# Patient Record
Sex: Male | Born: 1940 | Race: White | Hispanic: No | Marital: Married | Smoking: Former smoker
Health system: Southern US, Community
[De-identification: ages and names within clinical notes are randomized; demographics above are authoritative.]

## PROBLEM LIST (undated history)

## (undated) DIAGNOSIS — Z860100 Personal history of colon polyps, unspecified: Secondary | ICD-10-CM

## (undated) DIAGNOSIS — M069 Rheumatoid arthritis, unspecified: Secondary | ICD-10-CM

## (undated) HISTORY — DX: Personal history of colonic polyps: Z86.010

## (undated) HISTORY — DX: Rheumatoid arthritis, unspecified: M06.9

## (undated) HISTORY — DX: Personal history of colon polyps, unspecified: Z86.0100

## (undated) HISTORY — PX: ROTATOR CUFF REPAIR: SHX139

---

## 2006-06-05 ENCOUNTER — Encounter: Payer: Self-pay | Admitting: Internal Medicine

## 2006-10-15 LAB — CONVERTED CEMR LAB
PSA: 1.9 ng/mL
PSA: 1.9 ng/mL
PSA: 1.9 ng/mL
PSA: 1.9 ng/mL
PSA: 1.9 ng/mL
PSA: 1.9 ng/mL

## 2008-01-08 ENCOUNTER — Ambulatory Visit: Payer: Self-pay | Admitting: Internal Medicine

## 2008-01-08 DIAGNOSIS — M069 Rheumatoid arthritis, unspecified: Secondary | ICD-10-CM | POA: Insufficient documentation

## 2008-01-08 DIAGNOSIS — Z860100 Personal history of colon polyps, unspecified: Secondary | ICD-10-CM | POA: Insufficient documentation

## 2008-01-08 LAB — CONVERTED CEMR LAB
Bilirubin Urine: NEGATIVE
Glucose, Urine, Semiquant: NEGATIVE
Ketones, urine, test strip: NEGATIVE
Nitrite: NEGATIVE
Protein, U semiquant: NEGATIVE
Specific Gravity, Urine: 1.01
Urobilinogen, UA: 0.2
WBC Urine, dipstick: NEGATIVE
pH: 6

## 2008-01-09 LAB — CONVERTED CEMR LAB
ALT: 20 units/L (ref 0–53)
AST: 22 units/L (ref 0–37)
Albumin: 3.8 g/dL (ref 3.5–5.2)
Alkaline Phosphatase: 48 units/L (ref 39–117)
BUN: 14 mg/dL (ref 6–23)
Basophils Absolute: 0 10*3/uL (ref 0.0–0.1)
Basophils Relative: 0.1 % (ref 0.0–3.0)
Bilirubin, Direct: 0.1 mg/dL (ref 0.0–0.3)
CO2: 30 meq/L (ref 19–32)
Calcium: 9 mg/dL (ref 8.4–10.5)
Chloride: 104 meq/L (ref 96–112)
Cholesterol: 159 mg/dL (ref 0–200)
Creatinine, Ser: 0.9 mg/dL (ref 0.4–1.5)
Eosinophils Absolute: 0.1 10*3/uL (ref 0.0–0.7)
Eosinophils Relative: 1.9 % (ref 0.0–5.0)
GFR calc Af Amer: 109 mL/min
GFR calc non Af Amer: 90 mL/min
Glucose, Bld: 80 mg/dL (ref 70–99)
HCT: 46.4 % (ref 39.0–52.0)
HDL: 40.2 mg/dL (ref >39.0)
Hemoglobin: 15.6 g/dL (ref 13.0–17.0)
LDL Cholesterol: 96 mg/dL (ref 0–99)
Lymphocytes Relative: 21.8 % (ref 12.0–46.0)
MCHC: 33.7 g/dL (ref 30.0–36.0)
MCV: 96 fL (ref 78.0–100.0)
Monocytes Absolute: 0.6 10*3/uL (ref 0.1–1.0)
Monocytes Relative: 9.5 % (ref 3.0–12.0)
Neutro Abs: 4.6 10*3/uL (ref 1.4–7.7)
Neutrophils Relative %: 66.7 % (ref 43.0–77.0)
PSA: 2.3 ng/mL (ref 0.10–4.00)
Platelets: 208 10*3/uL (ref 150–400)
Potassium: 4 meq/L (ref 3.5–5.1)
RBC: 4.84 M/uL (ref 4.22–5.81)
RDW: 13.9 % (ref 11.5–14.6)
Sodium: 140 meq/L (ref 135–145)
TSH: 2.03 microintl units/mL (ref 0.35–5.50)
Total Bilirubin: 1 mg/dL (ref 0.3–1.2)
Total CHOL/HDL Ratio: 4
Total Protein: 7.5 g/dL (ref 6.0–8.3)
Triglycerides: 115 mg/dL (ref 0–149)
VLDL: 23 mg/dL (ref 0–40)
WBC: 6.8 10*3/uL (ref 4.5–10.5)

## 2008-01-10 ENCOUNTER — Encounter: Payer: Self-pay | Admitting: Internal Medicine

## 2008-02-11 ENCOUNTER — Encounter: Payer: Self-pay | Admitting: Internal Medicine

## 2009-02-12 ENCOUNTER — Ambulatory Visit: Payer: Self-pay | Admitting: Internal Medicine

## 2009-02-12 LAB — CONVERTED CEMR LAB
ALT: 19 units/L (ref 0–53)
AST: 28 units/L (ref 0–37)
Albumin: 3.9 g/dL (ref 3.5–5.2)
Alkaline Phosphatase: 51 units/L (ref 39–117)
BUN: 17 mg/dL (ref 6–23)
Basophils Absolute: 0 10*3/uL (ref 0.0–0.1)
Basophils Relative: 0.1 % (ref 0.0–3.0)
Bilirubin Urine: NEGATIVE
Bilirubin, Direct: 0 mg/dL (ref 0.0–0.3)
Blood in Urine, dipstick: NEGATIVE
CO2: 29 meq/L (ref 19–32)
Calcium: 8.7 mg/dL (ref 8.4–10.5)
Chloride: 109 meq/L (ref 96–112)
Cholesterol: 174 mg/dL (ref 0–200)
Creatinine, Ser: 0.9 mg/dL (ref 0.4–1.5)
Eosinophils Absolute: 0.2 10*3/uL (ref 0.0–0.7)
Eosinophils Relative: 2.8 % (ref 0.0–5.0)
GFR calc non Af Amer: 89.42 mL/min (ref >60)
Glucose, Bld: 100 mg/dL — ABNORMAL HIGH (ref 70–99)
Glucose, Urine, Semiquant: NEGATIVE
HCT: 47.1 % (ref 39.0–52.0)
HDL: 43.2 mg/dL (ref >39.00)
Hemoglobin: 16 g/dL (ref 13.0–17.0)
Ketones, urine, test strip: NEGATIVE
LDL Cholesterol: 113 mg/dL — ABNORMAL HIGH (ref 0–99)
Lymphocytes Relative: 22.4 % (ref 12.0–46.0)
Lymphs Abs: 1.6 10*3/uL (ref 0.7–4.0)
MCHC: 34.1 g/dL (ref 30.0–36.0)
MCV: 95 fL (ref 78.0–100.0)
Monocytes Absolute: 0.8 10*3/uL (ref 0.1–1.0)
Monocytes Relative: 10.9 % (ref 3.0–12.0)
Neutro Abs: 4.6 10*3/uL (ref 1.4–7.7)
Neutrophils Relative %: 63.8 % (ref 43.0–77.0)
Nitrite: NEGATIVE
PSA: 1.84 ng/mL (ref 0.10–4.00)
Platelets: 166 10*3/uL (ref 150.0–400.0)
Potassium: 4 meq/L (ref 3.5–5.1)
Protein, U semiquant: NEGATIVE
RBC: 4.96 M/uL (ref 4.22–5.81)
RDW: 14 % (ref 11.5–14.6)
Sodium: 142 meq/L (ref 135–145)
Specific Gravity, Urine: 1.015
TSH: 1.5 microintl units/mL (ref 0.35–5.50)
Total Bilirubin: 0.8 mg/dL (ref 0.3–1.2)
Total CHOL/HDL Ratio: 4
Total Protein: 7.9 g/dL (ref 6.0–8.3)
Triglycerides: 89 mg/dL (ref 0.0–149.0)
Urobilinogen, UA: 0.2
VLDL: 17.8 mg/dL (ref 0.0–40.0)
Vit D, 25-Hydroxy: 41 ng/mL (ref 30–89)
WBC Urine, dipstick: NEGATIVE
WBC: 7.2 10*3/uL (ref 4.5–10.5)
pH: 5.5

## 2009-02-18 ENCOUNTER — Ambulatory Visit: Payer: Self-pay | Admitting: Internal Medicine

## 2009-02-18 DIAGNOSIS — T783XXA Angioneurotic edema, initial encounter: Secondary | ICD-10-CM | POA: Insufficient documentation

## 2009-02-22 ENCOUNTER — Encounter: Payer: Self-pay | Admitting: Internal Medicine

## 2009-08-23 ENCOUNTER — Ambulatory Visit: Payer: Self-pay | Admitting: Internal Medicine

## 2009-08-23 LAB — CONVERTED CEMR LAB
AST: 27 units/L (ref 0–37)
BUN: 15 mg/dL (ref 6–23)
Basophils Absolute: 0 10*3/uL (ref 0.0–0.1)
Basophils Relative: 0.7 % (ref 0.0–3.0)
CRP, High Sensitivity: 3.5 (ref 0.00–5.00)
Eosinophils Absolute: 0.1 10*3/uL (ref 0.0–0.7)
GFR calc non Af Amer: 89.27 mL/min (ref >60)
MCHC: 33.3 g/dL (ref 30.0–36.0)
MCV: 97 fL (ref 78.0–100.0)
Monocytes Absolute: 0.6 10*3/uL (ref 0.1–1.0)
Neutrophils Relative %: 62.2 % (ref 43.0–77.0)
Platelets: 161 10*3/uL (ref 150.0–400.0)
Potassium: 4.1 meq/L (ref 3.5–5.1)
RBC: 4.77 M/uL (ref 4.22–5.81)
RDW: 13.8 % (ref 11.5–14.6)
Sed Rate: 10 mm/hr (ref 0–22)
Total Bilirubin: 0.7 mg/dL (ref 0.3–1.2)

## 2009-11-24 ENCOUNTER — Ambulatory Visit: Payer: Self-pay | Admitting: Internal Medicine

## 2009-11-24 LAB — CONVERTED CEMR LAB
ALT: 19 units/L (ref 0–53)
BUN: 18 mg/dL (ref 6–23)
Basophils Relative: 0.5 % (ref 0.0–3.0)
CO2: 30 meq/L (ref 19–32)
CRP, High Sensitivity: 3.44 (ref 0.00–5.00)
Chloride: 105 meq/L (ref 96–112)
Creatinine, Ser: 1 mg/dL (ref 0.4–1.5)
Hemoglobin: 15.3 g/dL (ref 13.0–17.0)
Lymphocytes Relative: 19.4 % (ref 12.0–46.0)
MCHC: 34 g/dL (ref 30.0–36.0)
Monocytes Relative: 10.3 % (ref 3.0–12.0)
Neutro Abs: 5.3 10*3/uL (ref 1.4–7.7)
RBC: 4.77 M/uL (ref 4.22–5.81)
Sed Rate: 12 mm/hr (ref 0–22)
Total Protein: 7.3 g/dL (ref 6.0–8.3)

## 2010-02-15 ENCOUNTER — Ambulatory Visit: Payer: Self-pay | Admitting: Internal Medicine

## 2010-02-15 LAB — CONVERTED CEMR LAB
Alkaline Phosphatase: 50 units/L (ref 39–117)
Basophils Relative: 0.5 % (ref 0.0–3.0)
Bilirubin, Direct: 0.1 mg/dL (ref 0.0–0.3)
CO2: 29 meq/L (ref 19–32)
Calcium: 8.9 mg/dL (ref 8.4–10.5)
Creatinine, Ser: 0.9 mg/dL (ref 0.4–1.5)
Eosinophils Absolute: 0.1 10*3/uL (ref 0.0–0.7)
Lymphocytes Relative: 20.4 % (ref 12.0–46.0)
MCHC: 34 g/dL (ref 30.0–36.0)
Neutrophils Relative %: 66.1 % (ref 43.0–77.0)
RBC: 4.79 M/uL (ref 4.22–5.81)
Total Bilirubin: 0.7 mg/dL (ref 0.3–1.2)
Total Protein: 6.9 g/dL (ref 6.0–8.3)
WBC: 7 10*3/uL (ref 4.5–10.5)

## 2010-02-18 ENCOUNTER — Telehealth: Payer: Self-pay | Admitting: Internal Medicine

## 2010-02-25 ENCOUNTER — Ambulatory Visit: Payer: Self-pay | Admitting: Internal Medicine

## 2010-02-25 LAB — CONVERTED CEMR LAB
HDL: 43.4 mg/dL (ref >39.00)
LDL Cholesterol: 107 mg/dL — ABNORMAL HIGH (ref 0–99)
Total CHOL/HDL Ratio: 4
Triglycerides: 88 mg/dL (ref 0.0–149.0)
VLDL: 17.6 mg/dL (ref 0.0–40.0)

## 2010-03-21 ENCOUNTER — Ambulatory Visit: Payer: Self-pay | Admitting: Internal Medicine

## 2010-07-14 ENCOUNTER — Other Ambulatory Visit: Payer: Self-pay | Admitting: Internal Medicine

## 2010-07-14 ENCOUNTER — Ambulatory Visit
Admission: RE | Admit: 2010-07-14 | Discharge: 2010-07-14 | Payer: Self-pay | Source: Home / Self Care | Attending: Internal Medicine | Admitting: Internal Medicine

## 2010-07-14 LAB — BASIC METABOLIC PANEL
BUN: 21 mg/dL (ref 6–23)
CO2: 29 mEq/L (ref 19–32)
Calcium: 8.7 mg/dL (ref 8.4–10.5)
Chloride: 101 mEq/L (ref 96–112)
Creatinine, Ser: 1 mg/dL (ref 0.4–1.5)
GFR: 78.84 mL/min (ref >60.00)
Glucose, Bld: 98 mg/dL (ref 70–99)
Potassium: 4.9 mEq/L (ref 3.5–5.1)
Sodium: 136 mEq/L (ref 135–145)

## 2010-07-14 LAB — CBC WITH DIFFERENTIAL/PLATELET
Basophils Absolute: 0 10*3/uL (ref 0.0–0.1)
Basophils Relative: 0.5 % (ref 0.0–3.0)
Eosinophils Absolute: 0.1 10*3/uL (ref 0.0–0.7)
Eosinophils Relative: 1.6 % (ref 0.0–5.0)
HCT: 46.6 % (ref 39.0–52.0)
Hemoglobin: 15.8 g/dL (ref 13.0–17.0)
Lymphocytes Relative: 20.7 % (ref 12.0–46.0)
Lymphs Abs: 1.7 10*3/uL (ref 0.7–4.0)
MCHC: 34 g/dL (ref 30.0–36.0)
MCV: 94.9 fl (ref 78.0–100.0)
Monocytes Absolute: 0.8 10*3/uL (ref 0.1–1.0)
Monocytes Relative: 9.8 % (ref 3.0–12.0)
Neutro Abs: 5.4 10*3/uL (ref 1.4–7.7)
Neutrophils Relative %: 67.4 % (ref 43.0–77.0)
Platelets: 169 10*3/uL (ref 150.0–400.0)
RBC: 4.91 Mil/uL (ref 4.22–5.81)
RDW: 15.2 % — ABNORMAL HIGH (ref 11.5–14.6)
WBC: 8 10*3/uL (ref 4.5–10.5)

## 2010-07-14 LAB — HEPATIC FUNCTION PANEL
ALT: 23 U/L (ref 0–53)
AST: 27 U/L (ref 0–37)
Albumin: 3.9 g/dL (ref 3.5–5.2)
Alkaline Phosphatase: 53 U/L (ref 39–117)
Bilirubin, Direct: 0.1 mg/dL (ref 0.0–0.3)
Total Bilirubin: 0.4 mg/dL (ref 0.3–1.2)
Total Protein: 7.3 g/dL (ref 6.0–8.3)

## 2010-07-14 LAB — SEDIMENTATION RATE: Sed Rate: 10 mm/hr (ref 0–22)

## 2010-07-14 LAB — HIGH SENSITIVITY CRP: CRP, High Sensitivity: 2.5 mg/L (ref 0.00–5.00)

## 2010-07-26 NOTE — Assessment & Plan Note (Signed)
Summary: productive cough/cjr    Vital Signs:  Patient profile:   70 year old male Weight:      224 pounds Temp:     97.8 degrees F oral BP sitting:   140 / 102  (left arm) Cuff size:   regular  Vitals Entered By: Kathrynn Speed CMA (March 21, 2010 9:23 AM) CC: Productive cough, src   CC:  Productive cough and src.  History of Present Illness: 70 year old patient, who presents with a 3-day history of chest congestion and productive cough.  He is on immunosuppressant therapy due to rheumatoid arthritis.  He had some fever earlier in the week that seems to have improved.  He is still expectorating green sputum throughout the day, but most prominently in the morning.  Denies any chest pain or shortness of breath.  No recent fever or chills.  Preventive Screening-Counseling & Management  Alcohol-Tobacco     Smoking Status: quit     Year Quit: x 40 years  Current Medications (verified): 1)  Sulfasalazine 500 Mg  Tabs (Sulfasalazine) .... Take 1 Tablet By Mouth Once A Day 2)  Methotrexate 2.5 Mg  Tabs (Methotrexate Sodium) .... 4 Tablets Once A Week 3)  Folic Acid 1 Mg  Tabs (Folic Acid) .... Take 1 Tablet By Mouth Once A Day Except Methotrexate Day 4)  Aspirin 81 Mg  Tbec (Aspirin) .... Once Daily 5)  Co Q-10 50 Mg  Caps (Coenzyme Q10) .... Once Daily 6)  Vitamin C Cr 1000 Mg  Cr-Tabs (Ascorbic Acid) .... Once Daily 7)  Vitamin E Natural 400 Unit  Caps (Vitamin E) .... Once Daily 8)  Multivitamins  Tabs (Multiple Vitamin) .... Once Daily 9)  Zyrtec Hives Relief 10 Mg Tabs (Cetirizine Hcl) .... Take One Tab By Mouth Once Daily 10)  Fish Oil 1000 Mg Caps (Omega-3 Fatty Acids) .... Take One Tab By Mouth Once Daily  Allergies (verified): No Known Drug Allergies  Past History:  Past Medical History: Reviewed history from 01/08/2008 and no changes required. Colonic polyps, hx of Rheumatoid arthritis  Review of Systems       The patient complains of prolonged cough.  The  patient denies anorexia, fever, weight loss, weight gain, vision loss, decreased hearing, hoarseness, chest pain, syncope, dyspnea on exertion, peripheral edema, headaches, hemoptysis, abdominal pain, melena, hematochezia, severe indigestion/heartburn, hematuria, incontinence, genital sores, muscle weakness, suspicious skin lesions, transient blindness, difficulty walking, depression, unusual weight change, abnormal bleeding, enlarged lymph nodes, angioedema, and breast masses.    Physical Exam  General:  overweight-appearing.  no distress.  Blood pressure 140/96overweight-appearing.   Head:  Normocephalic and atraumatic without obvious abnormalities. No apparent alopecia or balding. Eyes:  No corneal or conjunctival inflammation noted. EOMI. Perrla. Funduscopic exam benign, without hemorrhages, exudates or papilledema. Vision grossly normal. Ears:  External ear exam shows no significant lesions or deformities.  Otoscopic examination reveals clear canals, tympanic membranes are intact bilaterally without bulging, retraction, inflammation or discharge. Hearing is grossly normal bilaterally. Mouth:  Oral mucosa and oropharynx without lesions or exudates.  Teeth in good repair. Neck:  No deformities, masses, or tenderness noted. Lungs:  scattered rhonchi noted at both lung bases Heart:  Normal rate and regular rhythm. S1 and S2 normal without gallop, murmur, click, rub or other extra sounds.   Impression & Recommendations:  Problem # 1:  BRONCHITIS-ACUTE (ICD-466.0)  His updated medication list for this problem includes:    Hydrocodone-homatropine 5-1.5 Mg/55ml Syrp (Hydrocodone-homatropine) .Marland Kitchen... 1 teaspoon every 6 hours  as needed for cough  His updated medication list for this problem includes:    Hydrocodone-homatropine 5-1.5 Mg/11ml Syrp (Hydrocodone-homatropine) .Marland Kitchen... 1 teaspoon every 6 hours as needed for cough  Complete Medication List: 1)  Sulfasalazine 500 Mg Tabs (Sulfasalazine) ....  Take 1 tablet by mouth once a day 2)  Methotrexate 2.5 Mg Tabs (Methotrexate sodium) .... 4 tablets once a week 3)  Folic Acid 1 Mg Tabs (Folic acid) .... Take 1 tablet by mouth once a day except methotrexate day 4)  Aspirin 81 Mg Tbec (Aspirin) .... Once daily 5)  Co Q-10 50 Mg Caps (Coenzyme q10) .... Once daily 6)  Vitamin C Cr 1000 Mg Cr-tabs (Ascorbic acid) .... Once daily 7)  Vitamin E Natural 400 Unit Caps (Vitamin e) .... Once daily 8)  Multivitamins Tabs (Multiple vitamin) .... Once daily 9)  Zyrtec Hives Relief 10 Mg Tabs (Cetirizine hcl) .... Take one tab by mouth once daily 10)  Fish Oil 1000 Mg Caps (Omega-3 fatty acids) .... Take one tab by mouth once daily 11)  Hydrocodone-homatropine 5-1.5 Mg/63ml Syrp (Hydrocodone-homatropine) .Marland Kitchen.. 1 teaspoon every 6 hours as needed for cough  Patient Instructions: 1)  Get plenty of rest, drink lots of clear liquids, and use Tylenol or Ibuprofen for fever and comfort. Return in 7-10 days if you're not better:sooner if you're feeling worse. 2)  Check your Blood Pressure regularly. If it is above: 140/90 you should make an appointment. Prescriptions: HYDROCODONE-HOMATROPINE 5-1.5 MG/5ML SYRP (HYDROCODONE-HOMATROPINE) 1 teaspoon every 6 hours as needed for cough  #6oz x 0   Entered and Authorized by:   Gordy Savers  MD   Signed by:   Gordy Savers  MD on 03/21/2010   Method used:   Print then Give to Patient   RxID:   [id number]

## 2010-07-26 NOTE — Assessment & Plan Note (Signed)
Summary: pt will come in fasting/njr   Vital Signs:  Patient profile:   70 year old male Height:      66 inches Weight:      222 pounds BMI:     35.96 Temp:     98.4 degrees F oral Pulse rate:   80 / minute Pulse rhythm:   regular BP sitting:   158 / 98  (left arm) Cuff size:   regular  Vitals Entered By: Kern Reap CMA Duncan Dull) (February 25, 2010 8:06 AM)  Nutrition Counseling: Patient's BMI is greater than 25 and therefore counseled on weight management options. CC: annual wellness exam Is Patient Diabetic? No Pain Assessment Patient in pain? no        CC:  annual wellness exam.  History of Present Illness: cpx  Preventive Screening-Counseling & Management  Alcohol-Tobacco     Smoking Status: quit     Year Quit: x 40 years  Current Problems (verified): 1)  Angioedema  (ICD-995.1) 2)  Preventive Health Care  (ICD-V70.0) 3)  Rheumatoid Arthritis  (ICD-714.0) 4)  Colonic Polyps, Hx of  (ICD-V12.72)  Current Medications (verified): 1)  Sulfasalazine 500 Mg  Tabs (Sulfasalazine) .... Take 1 Tablet By Mouth Once A Day 2)  Methotrexate 2.5 Mg  Tabs (Methotrexate Sodium) .... 4 Tablets Once A Week 3)  Folic Acid 1 Mg  Tabs (Folic Acid) .... Take 1 Tablet By Mouth Once A Day Except Methotrexate Day 4)  Aspirin 81 Mg  Tbec (Aspirin) .... Once Daily 5)  Co Q-10 50 Mg  Caps (Coenzyme Q10) .... Once Daily 6)  Vitamin C Cr 1000 Mg  Cr-Tabs (Ascorbic Acid) .... Once Daily 7)  Vitamin E Natural 400 Unit  Caps (Vitamin E) .... Once Daily 8)  Multivitamins  Tabs (Multiple Vitamin) .... Once Daily 9)  Zyrtec Hives Relief 10 Mg Tabs (Cetirizine Hcl) .... Take One Tab By Mouth Once Daily 10)  Fish Oil 1000 Mg Caps (Omega-3 Fatty Acids) .... Take One Tab By Mouth Once Daily  Allergies (verified): No Known Drug Allergies  Past History:  Past Medical History: Last updated: 01/15/2008 Colonic polyps, hx of Rheumatoid arthritis  Past Surgical History: Last updated:  2008-01-15 Rotator cuff repair--right  Family History: Last updated: 2008/01/15 father-deceased MVA age 73 mother deceased 58 yo CHF, Asthma  Social History: Last updated: Jan 15, 2008 Occupation: "navigators"---[employer]  Never Smoked widowed twice Alcohol use-yes Regular exercise-yes 25 minutes cardio daily  Risk Factors: Exercise: yes (15-Jan-2008)  Risk Factors: Smoking Status: quit (02/25/2010)  Social History: Smoking Status:  quit  Review of Systems       Flu Vaccine Consent Questions     Do you have a history of severe allergic reactions to this vaccine? no    Any prior history of allergic reactions to egg and/or gelatin? no    Do you have a sensitivity to the preservative Thimersol? no    Do you have a past history of Guillan-Barre Syndrome? no    Do you currently have an acute febrile illness? no    Have you ever had a severe reaction to latex? no    Vaccine information given and explained to patient? yes    Are you currently pregnant? no    Lot Number:[id number]   Exp Date:12/24/2010   Site Given  Left Deltoid IM   Physical Exam  General:  alert and well-developed.   Head:  normocephalic and atraumatic.   Eyes:  pupils equal and pupils round.  Ears:  R ear normal and L ear normal.   Neck:  No deformities, masses, or tenderness noted. Chest Wall:  No deformities, masses, tenderness or gynecomastia noted. Lungs:  Normal respiratory effort, chest expands symmetrically. Lungs are clear to auscultation, no crackles or wheezes. Heart:  normal rate and regular rhythm.   Abdomen:  soft and non-tender.  overweight Prostate:  no nodules and no asymmetry.   Msk:  No deformity or scoliosis noted of thoracic or lumbar spine.   Neurologic:  cranial nerves II-XII intact and gait normal.   Skin:  turgor normal and color normal.  few AKs Cervical Nodes:  no anterior cervical adenopathy and no posterior cervical adenopathy.   Psych:  normally  interactive and good eye contact.     Impression & Recommendations:  Problem # 1:  PREVENTIVE HEALTH CARE (ICD-V70.0)  health maint UTD  Orders: Venipuncture (16109) TLB-Lipid Panel (80061-LIPID) TLB-PSA (Prostate Specific Antigen) (84153-PSA)  Problem # 2:  RHEUMATOID ARTHRITIS (ICD-714.0) followed by rheumatology labs normal  Complete Medication List: 1)  Sulfasalazine 500 Mg Tabs (Sulfasalazine) .... Take 1 tablet by mouth once a day 2)  Methotrexate 2.5 Mg Tabs (Methotrexate sodium) .... 4 tablets once a week 3)  Folic Acid 1 Mg Tabs (Folic acid) .... Take 1 tablet by mouth once a day except methotrexate day 4)  Aspirin 81 Mg Tbec (Aspirin) .... Once daily 5)  Co Q-10 50 Mg Caps (Coenzyme q10) .... Once daily 6)  Vitamin C Cr 1000 Mg Cr-tabs (Ascorbic acid) .... Once daily 7)  Vitamin E Natural 400 Unit Caps (Vitamin e) .... Once daily 8)  Multivitamins Tabs (Multiple vitamin) .... Once daily 9)  Zyrtec Hives Relief 10 Mg Tabs (Cetirizine hcl) .... Take one tab by mouth once daily 10)  Fish Oil 1000 Mg Caps (Omega-3 fatty acids) .... Take one tab by mouth once daily  Other Orders: Admin 1st Vaccine (60454) Flu Vaccine 66yrs + (09811)    Appended Document: Orders Update    Clinical Lists Changes  Orders: Added new Service order of Specimen Handling (91478) - Signed

## 2010-07-26 NOTE — Progress Notes (Signed)
Summary: Pt req to pick up copy of labs from 02/15/10  Phone Note Call from Patient Call back at Home Phone [phone]   Caller: Patient Summary of Call: Pt req to come in and pick up a copy of labs from Aug 23,2011. Please call pt when ready for pick up. Initial call taken by: Lucy Antigua,  February 18, 2010 9:56 AM  Follow-up for Phone Call        Notified pt. Follow-up by: Lynann Beaver CMA,  February 18, 2010 11:30 AM

## 2010-07-26 NOTE — Letter (Signed)
Summary: [facility]   Imported By: Maryln Gottron 09/22/2009 12:46:20  _____________________________________________________________________  External Attachment:    Type:   Image     Comment:   External Document

## 2011-02-24 ENCOUNTER — Encounter: Payer: Self-pay | Admitting: Internal Medicine

## 2011-03-01 ENCOUNTER — Ambulatory Visit (INDEPENDENT_AMBULATORY_CARE_PROVIDER_SITE_OTHER): Payer: 59 | Admitting: Internal Medicine

## 2011-03-01 ENCOUNTER — Encounter: Payer: Self-pay | Admitting: Internal Medicine

## 2011-03-01 VITALS — BP 158/100 | HR 92 | Temp 97.9°F | Ht 66.0 in | Wt 228.0 lb

## 2011-03-01 DIAGNOSIS — Z23 Encounter for immunization: Secondary | ICD-10-CM

## 2011-03-01 DIAGNOSIS — Z0000 Encounter for general adult medical examination without abnormal findings: Secondary | ICD-10-CM

## 2011-03-01 DIAGNOSIS — M069 Rheumatoid arthritis, unspecified: Secondary | ICD-10-CM

## 2011-03-01 LAB — BASIC METABOLIC PANEL
BUN: 14 mg/dL (ref 6–23)
Creatinine, Ser: 1 mg/dL (ref 0.4–1.5)
GFR: 83.5 mL/min (ref >60.00)

## 2011-03-01 LAB — CBC WITH DIFFERENTIAL/PLATELET
Basophils Relative: 0.5 % (ref 0.0–3.0)
Eosinophils Relative: 1.6 % (ref 0.0–5.0)
Lymphocytes Relative: 21.1 % (ref 12.0–46.0)
MCV: 94.3 fl (ref 78.0–100.0)
Neutrophils Relative %: 67 % (ref 43.0–77.0)
RBC: 5.03 Mil/uL (ref 4.22–5.81)
WBC: 6.6 10*3/uL (ref 4.5–10.5)

## 2011-03-01 LAB — POCT URINALYSIS DIPSTICK
Blood, UA: NEGATIVE
Glucose, UA: NEGATIVE
Nitrite, UA: NEGATIVE
Protein, UA: NEGATIVE
Urobilinogen, UA: 0.2

## 2011-03-01 LAB — HEPATIC FUNCTION PANEL
ALT: 25 U/L (ref 0–53)
Alkaline Phosphatase: 53 U/L (ref 39–117)
Bilirubin, Direct: 0.1 mg/dL (ref 0.0–0.3)
Total Protein: 7.1 g/dL (ref 6.0–8.3)

## 2011-03-01 LAB — SEDIMENTATION RATE: Sed Rate: 11 mm/hr (ref 0–22)

## 2011-03-01 LAB — LIPID PANEL
HDL: 48.8 mg/dL (ref >39.00)
Total CHOL/HDL Ratio: 3

## 2011-03-01 LAB — PSA: PSA: 2.21 ng/mL (ref 0.10–4.00)

## 2011-03-01 NOTE — Progress Notes (Signed)
  Subjective:    Patient ID: Jimmy Williams, male    DOB: 08/22/1940, 70 y.o.   MRN: [id number]  HPI  cpx Home bps: 130s/70s  Pt has no complaints Has regular f/u with rheumatology  Past Medical History  Diagnosis Date  . Hx of colonic polyps   . Rheumatoid arthritis    Past Surgical History  Procedure Date  . Rotator cuff repair     right    reports that he has quit smoking. He does not have any smokeless tobacco history on file. His alcohol and drug histories not on file. family history is not on file. No Known Allergies   Review of Systems  patient denies chest pain, shortness of breath, orthopnea. Denies lower extremity edema, abdominal pain, change in appetite, change in bowel movements. Patient denies rashes, musculoskeletal complaints. No other specific complaints in a complete review of systems.      Objective:   Physical Exam   well-developed well-nourished male in no acute distress. HEENT exam atraumatic, normocephalic, neck supple without jugular venous distention. Chest clear to auscultation cardiac exam S1-S2 are regular. Abdominal exam overweight with bowel sounds, soft and nontender. Extremities no edema. Neurologic exam is alert with a normal gait.       Assessment & Plan:   Health maint UTD

## 2011-05-16 ENCOUNTER — Telehealth: Payer: Self-pay | Admitting: Internal Medicine

## 2011-05-16 DIAGNOSIS — M069 Rheumatoid arthritis, unspecified: Secondary | ICD-10-CM

## 2011-05-16 NOTE — Telephone Encounter (Signed)
Pt has been seeing Dr. Dareen Piano a Rheumatologist but he is out of his network, pt is requesting to be referred to a new doctor within his network. Please contact pt

## 2011-05-16 NOTE — Telephone Encounter (Signed)
Referral order placed.

## 2016-09-11 DIAGNOSIS — E663 Overweight: Secondary | ICD-10-CM | POA: Diagnosis not present

## 2016-09-11 DIAGNOSIS — Z79899 Other long term (current) drug therapy: Secondary | ICD-10-CM | POA: Diagnosis not present

## 2016-09-11 DIAGNOSIS — Z6829 Body mass index (BMI) 29.0-29.9, adult: Secondary | ICD-10-CM | POA: Diagnosis not present

## 2016-09-11 DIAGNOSIS — M2550 Pain in unspecified joint: Secondary | ICD-10-CM | POA: Diagnosis not present

## 2016-09-11 DIAGNOSIS — M0589 Other rheumatoid arthritis with rheumatoid factor of multiple sites: Secondary | ICD-10-CM | POA: Diagnosis not present

## 2016-11-29 DIAGNOSIS — L570 Actinic keratosis: Secondary | ICD-10-CM | POA: Diagnosis not present

## 2016-11-29 DIAGNOSIS — D225 Melanocytic nevi of trunk: Secondary | ICD-10-CM | POA: Diagnosis not present

## 2016-11-29 DIAGNOSIS — L814 Other melanin hyperpigmentation: Secondary | ICD-10-CM | POA: Diagnosis not present

## 2016-11-29 DIAGNOSIS — L821 Other seborrheic keratosis: Secondary | ICD-10-CM | POA: Diagnosis not present

## 2016-12-12 DIAGNOSIS — E663 Overweight: Secondary | ICD-10-CM | POA: Diagnosis not present

## 2016-12-12 DIAGNOSIS — Z6829 Body mass index (BMI) 29.0-29.9, adult: Secondary | ICD-10-CM | POA: Diagnosis not present

## 2016-12-12 DIAGNOSIS — M2550 Pain in unspecified joint: Secondary | ICD-10-CM | POA: Diagnosis not present

## 2016-12-12 DIAGNOSIS — Z79899 Other long term (current) drug therapy: Secondary | ICD-10-CM | POA: Diagnosis not present

## 2016-12-12 DIAGNOSIS — M0589 Other rheumatoid arthritis with rheumatoid factor of multiple sites: Secondary | ICD-10-CM | POA: Diagnosis not present

## 2016-12-17 DIAGNOSIS — J069 Acute upper respiratory infection, unspecified: Secondary | ICD-10-CM | POA: Diagnosis not present

## 2017-03-09 DIAGNOSIS — Z87891 Personal history of nicotine dependence: Secondary | ICD-10-CM | POA: Diagnosis not present

## 2017-03-09 DIAGNOSIS — Z23 Encounter for immunization: Secondary | ICD-10-CM | POA: Diagnosis not present

## 2017-03-09 DIAGNOSIS — E7800 Pure hypercholesterolemia, unspecified: Secondary | ICD-10-CM | POA: Diagnosis not present

## 2017-03-09 DIAGNOSIS — I10 Essential (primary) hypertension: Secondary | ICD-10-CM | POA: Diagnosis not present

## 2017-03-09 DIAGNOSIS — M05739 Rheumatoid arthritis with rheumatoid factor of unspecified wrist without organ or systems involvement: Secondary | ICD-10-CM | POA: Diagnosis not present

## 2017-03-15 DIAGNOSIS — E559 Vitamin D deficiency, unspecified: Secondary | ICD-10-CM | POA: Diagnosis not present

## 2017-03-15 DIAGNOSIS — E7800 Pure hypercholesterolemia, unspecified: Secondary | ICD-10-CM | POA: Diagnosis not present

## 2017-03-15 DIAGNOSIS — I10 Essential (primary) hypertension: Secondary | ICD-10-CM | POA: Diagnosis not present

## 2017-05-16 DIAGNOSIS — Z87891 Personal history of nicotine dependence: Secondary | ICD-10-CM | POA: Diagnosis not present

## 2017-05-16 DIAGNOSIS — Z6828 Body mass index (BMI) 28.0-28.9, adult: Secondary | ICD-10-CM | POA: Diagnosis not present

## 2017-05-16 DIAGNOSIS — S76211A Strain of adductor muscle, fascia and tendon of right thigh, initial encounter: Secondary | ICD-10-CM | POA: Diagnosis not present

## 2017-05-31 DIAGNOSIS — Z6828 Body mass index (BMI) 28.0-28.9, adult: Secondary | ICD-10-CM | POA: Diagnosis not present

## 2017-05-31 DIAGNOSIS — K4090 Unilateral inguinal hernia, without obstruction or gangrene, not specified as recurrent: Secondary | ICD-10-CM | POA: Diagnosis not present

## 2017-06-06 DIAGNOSIS — Z01810 Encounter for preprocedural cardiovascular examination: Secondary | ICD-10-CM | POA: Diagnosis not present

## 2017-06-06 DIAGNOSIS — Z87891 Personal history of nicotine dependence: Secondary | ICD-10-CM | POA: Diagnosis not present

## 2017-06-06 DIAGNOSIS — K4090 Unilateral inguinal hernia, without obstruction or gangrene, not specified as recurrent: Secondary | ICD-10-CM | POA: Diagnosis not present

## 2017-06-06 DIAGNOSIS — I10 Essential (primary) hypertension: Secondary | ICD-10-CM | POA: Diagnosis not present

## 2017-06-06 DIAGNOSIS — M069 Rheumatoid arthritis, unspecified: Secondary | ICD-10-CM | POA: Diagnosis not present

## 2017-07-03 DIAGNOSIS — M0589 Other rheumatoid arthritis with rheumatoid factor of multiple sites: Secondary | ICD-10-CM | POA: Diagnosis not present

## 2017-08-10 DIAGNOSIS — M2550 Pain in unspecified joint: Secondary | ICD-10-CM | POA: Diagnosis not present

## 2017-08-10 DIAGNOSIS — Z79899 Other long term (current) drug therapy: Secondary | ICD-10-CM | POA: Diagnosis not present

## 2017-08-10 DIAGNOSIS — E663 Overweight: Secondary | ICD-10-CM | POA: Diagnosis not present

## 2017-08-10 DIAGNOSIS — Z6829 Body mass index (BMI) 29.0-29.9, adult: Secondary | ICD-10-CM | POA: Diagnosis not present

## 2017-08-10 DIAGNOSIS — M0589 Other rheumatoid arthritis with rheumatoid factor of multiple sites: Secondary | ICD-10-CM | POA: Diagnosis not present

## 2017-08-13 DIAGNOSIS — N529 Male erectile dysfunction, unspecified: Secondary | ICD-10-CM | POA: Diagnosis not present

## 2017-08-13 DIAGNOSIS — Z0000 Encounter for general adult medical examination without abnormal findings: Secondary | ICD-10-CM | POA: Diagnosis not present

## 2017-08-13 DIAGNOSIS — Z1389 Encounter for screening for other disorder: Secondary | ICD-10-CM | POA: Diagnosis not present

## 2017-08-13 DIAGNOSIS — Z125 Encounter for screening for malignant neoplasm of prostate: Secondary | ICD-10-CM | POA: Diagnosis not present

## 2017-08-13 DIAGNOSIS — Z136 Encounter for screening for cardiovascular disorders: Secondary | ICD-10-CM | POA: Diagnosis not present

## 2017-08-13 DIAGNOSIS — M069 Rheumatoid arthritis, unspecified: Secondary | ICD-10-CM | POA: Diagnosis not present

## 2017-09-13 DIAGNOSIS — Z87891 Personal history of nicotine dependence: Secondary | ICD-10-CM | POA: Diagnosis not present

## 2017-09-13 DIAGNOSIS — Z6828 Body mass index (BMI) 28.0-28.9, adult: Secondary | ICD-10-CM | POA: Diagnosis not present

## 2017-09-13 DIAGNOSIS — F430 Acute stress reaction: Secondary | ICD-10-CM | POA: Diagnosis not present

## 2017-09-28 DIAGNOSIS — Z87891 Personal history of nicotine dependence: Secondary | ICD-10-CM | POA: Diagnosis not present

## 2017-09-28 DIAGNOSIS — J029 Acute pharyngitis, unspecified: Secondary | ICD-10-CM | POA: Diagnosis not present

## 2017-09-28 DIAGNOSIS — Z6828 Body mass index (BMI) 28.0-28.9, adult: Secondary | ICD-10-CM | POA: Diagnosis not present

## 2017-10-10 DIAGNOSIS — R05 Cough: Secondary | ICD-10-CM | POA: Diagnosis not present

## 2017-10-10 DIAGNOSIS — Z87891 Personal history of nicotine dependence: Secondary | ICD-10-CM | POA: Diagnosis not present

## 2017-10-10 DIAGNOSIS — Z6828 Body mass index (BMI) 28.0-28.9, adult: Secondary | ICD-10-CM | POA: Diagnosis not present

## 2017-10-25 DIAGNOSIS — Z6828 Body mass index (BMI) 28.0-28.9, adult: Secondary | ICD-10-CM | POA: Diagnosis not present

## 2017-10-25 DIAGNOSIS — Z79899 Other long term (current) drug therapy: Secondary | ICD-10-CM | POA: Diagnosis not present

## 2017-10-25 DIAGNOSIS — Z87891 Personal history of nicotine dependence: Secondary | ICD-10-CM | POA: Diagnosis not present

## 2017-10-25 DIAGNOSIS — J301 Allergic rhinitis due to pollen: Secondary | ICD-10-CM | POA: Diagnosis not present

## 2017-10-25 DIAGNOSIS — M05739 Rheumatoid arthritis with rheumatoid factor of unspecified wrist without organ or systems involvement: Secondary | ICD-10-CM | POA: Diagnosis not present

## 2017-11-30 DIAGNOSIS — M0589 Other rheumatoid arthritis with rheumatoid factor of multiple sites: Secondary | ICD-10-CM | POA: Diagnosis not present

## 2018-02-07 DIAGNOSIS — E663 Overweight: Secondary | ICD-10-CM | POA: Diagnosis not present

## 2018-02-07 DIAGNOSIS — M2550 Pain in unspecified joint: Secondary | ICD-10-CM | POA: Diagnosis not present

## 2018-02-07 DIAGNOSIS — M0589 Other rheumatoid arthritis with rheumatoid factor of multiple sites: Secondary | ICD-10-CM | POA: Diagnosis not present

## 2018-02-07 DIAGNOSIS — Z79899 Other long term (current) drug therapy: Secondary | ICD-10-CM | POA: Diagnosis not present

## 2018-02-07 DIAGNOSIS — Z6827 Body mass index (BMI) 27.0-27.9, adult: Secondary | ICD-10-CM | POA: Diagnosis not present

## 2018-02-12 DIAGNOSIS — R634 Abnormal weight loss: Secondary | ICD-10-CM | POA: Diagnosis not present

## 2018-02-12 DIAGNOSIS — I10 Essential (primary) hypertension: Secondary | ICD-10-CM | POA: Diagnosis not present

## 2018-02-12 DIAGNOSIS — H00015 Hordeolum externum left lower eyelid: Secondary | ICD-10-CM | POA: Diagnosis not present

## 2018-03-06 DIAGNOSIS — L989 Disorder of the skin and subcutaneous tissue, unspecified: Secondary | ICD-10-CM | POA: Diagnosis not present

## 2018-03-06 DIAGNOSIS — L821 Other seborrheic keratosis: Secondary | ICD-10-CM | POA: Diagnosis not present

## 2018-03-06 DIAGNOSIS — L814 Other melanin hyperpigmentation: Secondary | ICD-10-CM | POA: Diagnosis not present

## 2018-03-06 DIAGNOSIS — D229 Melanocytic nevi, unspecified: Secondary | ICD-10-CM | POA: Diagnosis not present

## 2018-03-06 DIAGNOSIS — D485 Neoplasm of uncertain behavior of skin: Secondary | ICD-10-CM | POA: Diagnosis not present

## 2018-03-06 DIAGNOSIS — D0359 Melanoma in situ of other part of trunk: Secondary | ICD-10-CM | POA: Diagnosis not present

## 2018-04-01 DIAGNOSIS — L905 Scar conditions and fibrosis of skin: Secondary | ICD-10-CM | POA: Diagnosis not present

## 2018-04-01 DIAGNOSIS — D0359 Melanoma in situ of other part of trunk: Secondary | ICD-10-CM | POA: Diagnosis not present

## 2018-05-10 DIAGNOSIS — M0589 Other rheumatoid arthritis with rheumatoid factor of multiple sites: Secondary | ICD-10-CM | POA: Diagnosis not present

## 2018-07-16 DIAGNOSIS — D229 Melanocytic nevi, unspecified: Secondary | ICD-10-CM | POA: Diagnosis not present

## 2018-07-16 DIAGNOSIS — D1801 Hemangioma of skin and subcutaneous tissue: Secondary | ICD-10-CM | POA: Diagnosis not present

## 2018-07-16 DIAGNOSIS — L819 Disorder of pigmentation, unspecified: Secondary | ICD-10-CM | POA: Diagnosis not present

## 2018-07-16 DIAGNOSIS — L814 Other melanin hyperpigmentation: Secondary | ICD-10-CM | POA: Diagnosis not present

## 2018-07-16 DIAGNOSIS — Z85820 Personal history of malignant melanoma of skin: Secondary | ICD-10-CM | POA: Diagnosis not present

## 2018-07-16 DIAGNOSIS — L821 Other seborrheic keratosis: Secondary | ICD-10-CM | POA: Diagnosis not present

## 2018-08-14 DIAGNOSIS — Z6829 Body mass index (BMI) 29.0-29.9, adult: Secondary | ICD-10-CM | POA: Diagnosis not present

## 2018-08-14 DIAGNOSIS — E663 Overweight: Secondary | ICD-10-CM | POA: Diagnosis not present

## 2018-08-14 DIAGNOSIS — Z79899 Other long term (current) drug therapy: Secondary | ICD-10-CM | POA: Diagnosis not present

## 2018-08-14 DIAGNOSIS — M0589 Other rheumatoid arthritis with rheumatoid factor of multiple sites: Secondary | ICD-10-CM | POA: Diagnosis not present

## 2018-08-14 DIAGNOSIS — M2550 Pain in unspecified joint: Secondary | ICD-10-CM | POA: Diagnosis not present

## 2018-08-15 DIAGNOSIS — Z1389 Encounter for screening for other disorder: Secondary | ICD-10-CM | POA: Diagnosis not present

## 2018-08-15 DIAGNOSIS — Z0000 Encounter for general adult medical examination without abnormal findings: Secondary | ICD-10-CM | POA: Diagnosis not present

## 2018-08-15 DIAGNOSIS — M069 Rheumatoid arthritis, unspecified: Secondary | ICD-10-CM | POA: Diagnosis not present

## 2018-08-15 DIAGNOSIS — I10 Essential (primary) hypertension: Secondary | ICD-10-CM | POA: Diagnosis not present

## 2018-11-05 DIAGNOSIS — M0589 Other rheumatoid arthritis with rheumatoid factor of multiple sites: Secondary | ICD-10-CM | POA: Diagnosis not present

## 2018-11-05 DIAGNOSIS — L84 Corns and callosities: Secondary | ICD-10-CM | POA: Diagnosis not present

## 2018-12-04 ENCOUNTER — Other Ambulatory Visit: Payer: Self-pay

## 2018-12-04 ENCOUNTER — Encounter: Payer: Self-pay | Admitting: Podiatry

## 2018-12-04 ENCOUNTER — Ambulatory Visit: Payer: Medicare PPO | Admitting: Podiatry

## 2018-12-04 VITALS — BP 148/84 | Temp 97.5°F

## 2018-12-04 DIAGNOSIS — M79671 Pain in right foot: Secondary | ICD-10-CM

## 2018-12-04 DIAGNOSIS — Q828 Other specified congenital malformations of skin: Secondary | ICD-10-CM | POA: Diagnosis not present

## 2018-12-04 NOTE — Patient Instructions (Signed)

## 2018-12-10 DIAGNOSIS — H524 Presbyopia: Secondary | ICD-10-CM | POA: Diagnosis not present

## 2018-12-10 DIAGNOSIS — H2513 Age-related nuclear cataract, bilateral: Secondary | ICD-10-CM | POA: Diagnosis not present

## 2018-12-18 NOTE — Progress Notes (Signed)
Subjective: Jimmy Williams presents today with cc painful callus noted on plantar aspect of right foot. Duration is greater than several months. Pain interferes  with daily activities which require weightbearing. He has attempted routine use of pumice stone with minimal relief. Pain is getting progressively worse. Patient is seeking professional help regarding symptoms.  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  Past Medical History:  Diagnosis Date  . Hx of colonic polyps   . Rheumatoid arthritis(714.0)      Patient Active Problem List   Diagnosis Date Noted  . ANGIOEDEMA 02/18/2009  . RHEUMATOID ARTHRITIS 01/08/2008  . COLONIC POLYPS, HX OF 01/08/2008     Past Surgical History:  Procedure Laterality Date  . ROTATOR CUFF REPAIR     right      Current Outpatient Medications:  .  ascorbic acid (VITAMIN C) 1000 MG tablet, Take 10,000 mg by mouth daily.  , Disp: , Rfl:  .  aspirin 81 MG chewable tablet, Chew 81 mg by mouth daily.  , Disp: , Rfl:  .  B-D TB SYRINGE 1CC/27GX1/2" 27G X 1/2" 1 ML MISC, , Disp: , Rfl:  .  cetirizine (ZYRTEC HIVES RELIEF) 10 MG tablet, Take 10 mg by mouth daily.  , Disp: , Rfl:  .  Cholecalciferol (VITAMIN D-1000 MAX ST) 25 MCG (1000 UT) tablet, Take by mouth., Disp: , Rfl:  .  Coenzyme Q10 50 MG CAPS, Take by mouth daily.  , Disp: , Rfl:  .  fish oil-omega-3 fatty acids 1000 MG capsule, Take 1,000 mg by mouth daily. , Disp: , Rfl:  .  folic acid (FOLVITE) 1 MG tablet, Take 1 mg by mouth daily. Methotrexate day , Disp: , Rfl:  .  losartan (COZAAR) 100 MG tablet, Take by mouth., Disp: , Rfl:  .  methotrexate (RHEUMATREX) 2.5 MG tablet, once a week. Take 8 tablets once a week. Caution:Chemotherapy. Protect from light., Disp: , Rfl:  .  Multiple Vitamin (MULTIVITAMIN) capsule, Take 1 capsule by mouth daily.  , Disp: , Rfl:  .  Resveratrol 100 MG CAPS, Take by mouth., Disp: , Rfl:  .  sulfaSALAzine  (AZULFIDINE) 500 MG tablet, Take 500 mg by mouth daily.  , Disp: , Rfl:  .  vitamin E (NATURAL VITAMIN E) 400 UNIT capsule, Take 400 Units by mouth daily.  , Disp: , Rfl:    No Known Allergies   Social History   Occupational History  . Not on file  Tobacco Use  . Smoking status: Former Research scientist (life sciences)  . Smokeless tobacco: Former Network engineer and Sexual Activity  . Alcohol use: Not on file  . Drug use: Not on file  . Sexual activity: Not on file     History reviewed. No pertinent family history.   Immunization History  Administered Date(s) Administered  . Influenza Whole 02/25/2010  . Influenza-Unspecified 03/26/2013  . Pneumococcal Polysaccharide-23 12/25/2007  . Td 06/26/2000  . Tdap 03/01/2011     Review of systems: Positive Findings in bold print.  Constitutional:  chills, fatigue, fever, sweats, weight change Communication: Optometrist, sign Ecologist, hand writing, iPad/Android device Head: headaches, head injury Eyes: changes in vision, eye pain, glaucoma, cataracts, macular degeneration, diplopia, glare,  light sensitivity, eyeglasses or contacts, blindness Ears nose mouth throat: hearing impaired, hearing aids,  ringing in ears, deaf, sign language,  vertigo,   nosebleeds,  rhinitis,  cold sores, snoring, swollen glands Cardiovascular: HTN, edema, arrhythmia,  pacemaker in place, defibrillator in place, chest pain/tightness, chronic anticoagulation, blood clot, heart failure, MI Peripheral Vascular: leg cramps, varicose veins, blood clots, lymphedema, varicosities Respiratory:  difficulty breathing, denies congestion, SOB, wheezing, cough, emphysema Gastrointestinal: change in appetite or weight, abdominal pain, constipation, diarrhea, nausea, vomiting, vomiting blood, change in bowel habits, abdominal pain, jaundice, rectal bleeding, hemorrhoids, GERD Genitourinary:  nocturia,  pain on urination, polyuria,  blood in urine, Clubb catheter, urinary urgency, ESRD on  hemodialysis Musculoskeletal: amputation, cramping, stiff joints, painful joints, decreased joint motion, fractures, OA, gout, hemiplegia, paraplegia, uses cane, wheelchair bound, uses walker, uses rollator Skin: , color change, dryness, itching, mole changes+changes in toenails,  rash, wound(s) Neurological: headaches, numbness in feet, paresthesias in feet, burning in feet, fainting,  seizures, change in speech. denies headaches, memory problems/poor historian, cerebral palsy, weakness, paralysis, CVA, TIA Endocrine: diabetes, hypothyroidism, hyperthyroidism,  goiter, dry mouth, flushing, heat intolerance,  cold intolerance,  excessive thirst, denies polyuria,  nocturia Hematological:  easy bleeding, excessive bleeding, easy bruising, enlarged lymph nodes, on long term blood thinner, history of past transusions Allergy/immunological:  hives, eczema, frequent infections, multiple drug allergies, seasonal allergies, transplant recipient, multiple food allergies Psychiatric:  anxiety, depression, mood disorder, suicidal ideations, hallucinations, insomnia  Objective: Vitals:   12/04/18 1113  BP: (!) 148/84  Temp: (!) 97.5 F (36.4 C)    Vascular Examination: Capillary refill time immediate  x 10 digits.  Dorsalis pedis and posterior tibial pulses present b/l.  No digital hair x 10 digits.  Skin temperature WNL b/l.  Dermatological Examination: Skin with normal turgor, texture and tone b/l.  Toenails 1-5 b/l are well maintained with adequate length.  Porokeratotic lesion plantar forefoot right foot with tenderness to palpation. No erythema, no edema, no drainage, no flocculence.  Musculoskeletal: Muscle strength 5/5 to all LE muscle groups.  Neurological: Sensation intact 5/5 b/l with 10 gram monofilament.  Vibratory sensation intact.  Assessment: Painful porokeratosis plantar forefoot right foot  Plan: 1. Discussed porokeratosis etiology and treatment  options. Porokeratosis plantar right forefoot pared and enucleated with sterile scalpel blade without incident. Applied callus pad for comfort. Patient purchased 2 additional pads. Patient to report any pedal injuries to medical professional immediately. Follow up as needed. Patient/POA to call should there be a concern in the interim.

## 2019-01-14 DIAGNOSIS — D229 Melanocytic nevi, unspecified: Secondary | ICD-10-CM | POA: Diagnosis not present

## 2019-01-14 DIAGNOSIS — L821 Other seborrheic keratosis: Secondary | ICD-10-CM | POA: Diagnosis not present

## 2019-01-14 DIAGNOSIS — L570 Actinic keratosis: Secondary | ICD-10-CM | POA: Diagnosis not present

## 2019-01-14 DIAGNOSIS — L853 Xerosis cutis: Secondary | ICD-10-CM | POA: Diagnosis not present

## 2019-01-14 DIAGNOSIS — L814 Other melanin hyperpigmentation: Secondary | ICD-10-CM | POA: Diagnosis not present

## 2019-01-14 DIAGNOSIS — Z85820 Personal history of malignant melanoma of skin: Secondary | ICD-10-CM | POA: Diagnosis not present

## 2019-01-14 DIAGNOSIS — L819 Disorder of pigmentation, unspecified: Secondary | ICD-10-CM | POA: Diagnosis not present

## 2019-01-14 DIAGNOSIS — D1801 Hemangioma of skin and subcutaneous tissue: Secondary | ICD-10-CM | POA: Diagnosis not present

## 2019-02-12 DIAGNOSIS — Z79899 Other long term (current) drug therapy: Secondary | ICD-10-CM | POA: Diagnosis not present

## 2019-02-12 DIAGNOSIS — M0589 Other rheumatoid arthritis with rheumatoid factor of multiple sites: Secondary | ICD-10-CM | POA: Diagnosis not present

## 2019-02-12 DIAGNOSIS — M2550 Pain in unspecified joint: Secondary | ICD-10-CM | POA: Diagnosis not present

## 2019-03-28 DIAGNOSIS — R05 Cough: Secondary | ICD-10-CM | POA: Diagnosis not present

## 2019-03-28 DIAGNOSIS — Z23 Encounter for immunization: Secondary | ICD-10-CM | POA: Diagnosis not present

## 2019-03-28 DIAGNOSIS — R490 Dysphonia: Secondary | ICD-10-CM | POA: Diagnosis not present

## 2019-04-02 DIAGNOSIS — R490 Dysphonia: Secondary | ICD-10-CM | POA: Diagnosis not present

## 2019-05-15 DIAGNOSIS — M0589 Other rheumatoid arthritis with rheumatoid factor of multiple sites: Secondary | ICD-10-CM | POA: Diagnosis not present

## 2019-07-15 DIAGNOSIS — L814 Other melanin hyperpigmentation: Secondary | ICD-10-CM | POA: Diagnosis not present

## 2019-07-15 DIAGNOSIS — L570 Actinic keratosis: Secondary | ICD-10-CM | POA: Diagnosis not present

## 2019-07-15 DIAGNOSIS — D1801 Hemangioma of skin and subcutaneous tissue: Secondary | ICD-10-CM | POA: Diagnosis not present

## 2019-07-15 DIAGNOSIS — L853 Xerosis cutis: Secondary | ICD-10-CM | POA: Diagnosis not present

## 2019-07-15 DIAGNOSIS — D229 Melanocytic nevi, unspecified: Secondary | ICD-10-CM | POA: Diagnosis not present

## 2019-07-15 DIAGNOSIS — L905 Scar conditions and fibrosis of skin: Secondary | ICD-10-CM | POA: Diagnosis not present

## 2019-07-15 DIAGNOSIS — L718 Other rosacea: Secondary | ICD-10-CM | POA: Diagnosis not present

## 2019-07-15 DIAGNOSIS — L819 Disorder of pigmentation, unspecified: Secondary | ICD-10-CM | POA: Diagnosis not present

## 2019-07-15 DIAGNOSIS — L821 Other seborrheic keratosis: Secondary | ICD-10-CM | POA: Diagnosis not present

## 2019-08-03 ENCOUNTER — Ambulatory Visit: Payer: Medicare PPO | Attending: Internal Medicine

## 2019-08-03 DIAGNOSIS — Z23 Encounter for immunization: Secondary | ICD-10-CM

## 2019-08-03 NOTE — Progress Notes (Signed)
   Covid-19 Vaccination Clinic  Name:  Oldrich Nilo    MRN: [id number] DOB: Mar 24, 1941  08/03/2019  Mr. Mcneil was observed post Covid-19 immunization for 15 minutes without incidence. He was provided with Vaccine Information Sheet and instruction to access the V-Safe system.   Mr. Silos was instructed to call 911 with any severe reactions post vaccine: Marland Kitchen Difficulty breathing  . Swelling of your face and throat  . A fast heartbeat  . A bad rash all over your body  . Dizziness and weakness    Immunizations Administered    Name Date Dose VIS Date Route   Pfizer COVID-19 Vaccine 08/03/2019  3:39 PM 0.3 mL 06/06/2019 Intramuscular   Manufacturer: [place]   Lot: [id number]   [place]: [id number]

## 2019-08-15 DIAGNOSIS — E559 Vitamin D deficiency, unspecified: Secondary | ICD-10-CM | POA: Diagnosis not present

## 2019-08-15 DIAGNOSIS — Z79899 Other long term (current) drug therapy: Secondary | ICD-10-CM | POA: Diagnosis not present

## 2019-08-15 DIAGNOSIS — M2550 Pain in unspecified joint: Secondary | ICD-10-CM | POA: Diagnosis not present

## 2019-08-15 DIAGNOSIS — E663 Overweight: Secondary | ICD-10-CM | POA: Diagnosis not present

## 2019-08-15 DIAGNOSIS — Z6829 Body mass index (BMI) 29.0-29.9, adult: Secondary | ICD-10-CM | POA: Diagnosis not present

## 2019-08-15 DIAGNOSIS — M0589 Other rheumatoid arthritis with rheumatoid factor of multiple sites: Secondary | ICD-10-CM | POA: Diagnosis not present

## 2019-08-19 ENCOUNTER — Ambulatory Visit: Payer: 59

## 2019-08-28 ENCOUNTER — Ambulatory Visit: Payer: Medicare PPO | Attending: Internal Medicine

## 2019-08-28 DIAGNOSIS — Z23 Encounter for immunization: Secondary | ICD-10-CM | POA: Insufficient documentation

## 2019-08-28 NOTE — Progress Notes (Signed)
   Covid-19 Vaccination Clinic  Name:  Jimmy Williams    MRN: [id number] DOB: 07/20/1940  08/28/2019  [name] was observed post Covid-19 immunization for 15 minutes without incident. He was provided with Vaccine Information Sheet and instruction to access the V-Safe system.   [name] was instructed to call 911 with any severe reactions post vaccine: Marland Kitchen Difficulty breathing  . Swelling of face and throat  . A fast heartbeat  . A bad rash all over body  . Dizziness and weakness   Immunizations Administered    Name Date Dose VIS Date Route   Pfizer COVID-19 Vaccine 08/28/2019 10:07 AM 0.3 mL 06/06/2019 Intramuscular   Manufacturer: [place]   Lot: [id number]   [place]: [id number]

## 2019-11-12 DIAGNOSIS — M0589 Other rheumatoid arthritis with rheumatoid factor of multiple sites: Secondary | ICD-10-CM | POA: Diagnosis not present

## 2020-01-01 DIAGNOSIS — Z0000 Encounter for general adult medical examination without abnormal findings: Secondary | ICD-10-CM | POA: Diagnosis not present

## 2020-01-01 DIAGNOSIS — M069 Rheumatoid arthritis, unspecified: Secondary | ICD-10-CM | POA: Diagnosis not present

## 2020-01-01 DIAGNOSIS — I10 Essential (primary) hypertension: Secondary | ICD-10-CM | POA: Diagnosis not present

## 2020-01-01 DIAGNOSIS — Z85820 Personal history of malignant melanoma of skin: Secondary | ICD-10-CM | POA: Diagnosis not present

## 2020-01-14 DIAGNOSIS — L814 Other melanin hyperpigmentation: Secondary | ICD-10-CM | POA: Diagnosis not present

## 2020-01-14 DIAGNOSIS — L905 Scar conditions and fibrosis of skin: Secondary | ICD-10-CM | POA: Diagnosis not present

## 2020-01-14 DIAGNOSIS — L821 Other seborrheic keratosis: Secondary | ICD-10-CM | POA: Diagnosis not present

## 2020-01-14 DIAGNOSIS — D1801 Hemangioma of skin and subcutaneous tissue: Secondary | ICD-10-CM | POA: Diagnosis not present

## 2020-01-14 DIAGNOSIS — L570 Actinic keratosis: Secondary | ICD-10-CM | POA: Diagnosis not present

## 2020-01-14 DIAGNOSIS — L819 Disorder of pigmentation, unspecified: Secondary | ICD-10-CM | POA: Diagnosis not present

## 2020-01-14 DIAGNOSIS — Z85820 Personal history of malignant melanoma of skin: Secondary | ICD-10-CM | POA: Diagnosis not present

## 2020-01-14 DIAGNOSIS — D229 Melanocytic nevi, unspecified: Secondary | ICD-10-CM | POA: Diagnosis not present

## 2020-02-12 DIAGNOSIS — M2550 Pain in unspecified joint: Secondary | ICD-10-CM | POA: Diagnosis not present

## 2020-02-12 DIAGNOSIS — Z6830 Body mass index (BMI) 30.0-30.9, adult: Secondary | ICD-10-CM | POA: Diagnosis not present

## 2020-02-12 DIAGNOSIS — E669 Obesity, unspecified: Secondary | ICD-10-CM | POA: Diagnosis not present

## 2020-02-12 DIAGNOSIS — R5383 Other fatigue: Secondary | ICD-10-CM | POA: Diagnosis not present

## 2020-02-12 DIAGNOSIS — Z79899 Other long term (current) drug therapy: Secondary | ICD-10-CM | POA: Diagnosis not present

## 2020-02-12 DIAGNOSIS — M0589 Other rheumatoid arthritis with rheumatoid factor of multiple sites: Secondary | ICD-10-CM | POA: Diagnosis not present

## 2020-05-03 DIAGNOSIS — L821 Other seborrheic keratosis: Secondary | ICD-10-CM | POA: Diagnosis not present

## 2020-05-03 DIAGNOSIS — L578 Other skin changes due to chronic exposure to nonionizing radiation: Secondary | ICD-10-CM | POA: Diagnosis not present

## 2020-05-03 DIAGNOSIS — L819 Disorder of pigmentation, unspecified: Secondary | ICD-10-CM | POA: Diagnosis not present

## 2020-05-03 DIAGNOSIS — Z85820 Personal history of malignant melanoma of skin: Secondary | ICD-10-CM | POA: Diagnosis not present

## 2020-05-03 DIAGNOSIS — S61317A Laceration without foreign body of left little finger with damage to nail, initial encounter: Secondary | ICD-10-CM | POA: Diagnosis not present

## 2020-05-03 DIAGNOSIS — L905 Scar conditions and fibrosis of skin: Secondary | ICD-10-CM | POA: Diagnosis not present

## 2020-05-04 DIAGNOSIS — H02052 Trichiasis without entropian right lower eyelid: Secondary | ICD-10-CM | POA: Diagnosis not present

## 2020-05-04 DIAGNOSIS — H02055 Trichiasis without entropian left lower eyelid: Secondary | ICD-10-CM | POA: Diagnosis not present

## 2020-05-04 DIAGNOSIS — H5213 Myopia, bilateral: Secondary | ICD-10-CM | POA: Diagnosis not present

## 2020-05-04 DIAGNOSIS — H2513 Age-related nuclear cataract, bilateral: Secondary | ICD-10-CM | POA: Diagnosis not present

## 2020-05-11 DIAGNOSIS — Z4802 Encounter for removal of sutures: Secondary | ICD-10-CM | POA: Diagnosis not present

## 2020-05-11 DIAGNOSIS — L089 Local infection of the skin and subcutaneous tissue, unspecified: Secondary | ICD-10-CM | POA: Diagnosis not present

## 2020-05-11 DIAGNOSIS — S61219D Laceration without foreign body of unspecified finger without damage to nail, subsequent encounter: Secondary | ICD-10-CM | POA: Diagnosis not present

## 2020-05-13 DIAGNOSIS — M2550 Pain in unspecified joint: Secondary | ICD-10-CM | POA: Diagnosis not present

## 2020-05-13 DIAGNOSIS — E669 Obesity, unspecified: Secondary | ICD-10-CM | POA: Diagnosis not present

## 2020-05-13 DIAGNOSIS — M0589 Other rheumatoid arthritis with rheumatoid factor of multiple sites: Secondary | ICD-10-CM | POA: Diagnosis not present

## 2020-05-13 DIAGNOSIS — Z79899 Other long term (current) drug therapy: Secondary | ICD-10-CM | POA: Diagnosis not present

## 2020-05-13 DIAGNOSIS — Z6830 Body mass index (BMI) 30.0-30.9, adult: Secondary | ICD-10-CM | POA: Diagnosis not present

## 2020-05-14 DIAGNOSIS — S61213D Laceration without foreign body of left middle finger without damage to nail, subsequent encounter: Secondary | ICD-10-CM | POA: Diagnosis not present

## 2020-05-14 DIAGNOSIS — L089 Local infection of the skin and subcutaneous tissue, unspecified: Secondary | ICD-10-CM | POA: Diagnosis not present

## 2020-06-14 ENCOUNTER — Encounter (HOSPITAL_BASED_OUTPATIENT_CLINIC_OR_DEPARTMENT_OTHER): Payer: Medicare PPO | Admitting: Internal Medicine

## 2020-07-20 DIAGNOSIS — L570 Actinic keratosis: Secondary | ICD-10-CM | POA: Diagnosis not present

## 2020-07-20 DIAGNOSIS — L814 Other melanin hyperpigmentation: Secondary | ICD-10-CM | POA: Diagnosis not present

## 2020-07-20 DIAGNOSIS — Z85820 Personal history of malignant melanoma of skin: Secondary | ICD-10-CM | POA: Diagnosis not present

## 2020-07-20 DIAGNOSIS — L905 Scar conditions and fibrosis of skin: Secondary | ICD-10-CM | POA: Diagnosis not present

## 2020-07-20 DIAGNOSIS — D229 Melanocytic nevi, unspecified: Secondary | ICD-10-CM | POA: Diagnosis not present

## 2020-07-20 DIAGNOSIS — L821 Other seborrheic keratosis: Secondary | ICD-10-CM | POA: Diagnosis not present

## 2020-08-09 DIAGNOSIS — H02052 Trichiasis without entropian right lower eyelid: Secondary | ICD-10-CM | POA: Diagnosis not present

## 2020-08-13 DIAGNOSIS — M0589 Other rheumatoid arthritis with rheumatoid factor of multiple sites: Secondary | ICD-10-CM | POA: Diagnosis not present

## 2020-10-05 DIAGNOSIS — Z6831 Body mass index (BMI) 31.0-31.9, adult: Secondary | ICD-10-CM | POA: Diagnosis not present

## 2020-10-05 DIAGNOSIS — Z79899 Other long term (current) drug therapy: Secondary | ICD-10-CM | POA: Diagnosis not present

## 2020-10-05 DIAGNOSIS — M2550 Pain in unspecified joint: Secondary | ICD-10-CM | POA: Diagnosis not present

## 2020-10-05 DIAGNOSIS — M0589 Other rheumatoid arthritis with rheumatoid factor of multiple sites: Secondary | ICD-10-CM | POA: Diagnosis not present

## 2020-10-05 DIAGNOSIS — E669 Obesity, unspecified: Secondary | ICD-10-CM | POA: Diagnosis not present

## 2020-11-29 DIAGNOSIS — R030 Elevated blood-pressure reading, without diagnosis of hypertension: Secondary | ICD-10-CM | POA: Diagnosis not present

## 2020-11-29 DIAGNOSIS — H6121 Impacted cerumen, right ear: Secondary | ICD-10-CM | POA: Diagnosis not present

## 2020-12-06 DIAGNOSIS — I10 Essential (primary) hypertension: Secondary | ICD-10-CM | POA: Diagnosis not present

## 2020-12-06 DIAGNOSIS — M0579 Rheumatoid arthritis with rheumatoid factor of multiple sites without organ or systems involvement: Secondary | ICD-10-CM | POA: Diagnosis not present

## 2021-01-06 DIAGNOSIS — M0589 Other rheumatoid arthritis with rheumatoid factor of multiple sites: Secondary | ICD-10-CM | POA: Diagnosis not present

## 2021-03-02 DIAGNOSIS — R066 Hiccough: Secondary | ICD-10-CM | POA: Diagnosis not present

## 2021-03-02 DIAGNOSIS — Z7689 Persons encountering health services in other specified circumstances: Secondary | ICD-10-CM | POA: Diagnosis not present

## 2021-03-02 DIAGNOSIS — Z0000 Encounter for general adult medical examination without abnormal findings: Secondary | ICD-10-CM | POA: Diagnosis not present

## 2021-03-02 DIAGNOSIS — I10 Essential (primary) hypertension: Secondary | ICD-10-CM | POA: Diagnosis not present

## 2021-03-02 DIAGNOSIS — R490 Dysphonia: Secondary | ICD-10-CM | POA: Diagnosis not present

## 2021-03-02 DIAGNOSIS — M0579 Rheumatoid arthritis with rheumatoid factor of multiple sites without organ or systems involvement: Secondary | ICD-10-CM | POA: Diagnosis not present

## 2021-04-27 DIAGNOSIS — Z23 Encounter for immunization: Secondary | ICD-10-CM | POA: Diagnosis not present

## 2021-05-31 DIAGNOSIS — L821 Other seborrheic keratosis: Secondary | ICD-10-CM | POA: Diagnosis not present

## 2021-05-31 DIAGNOSIS — Z85820 Personal history of malignant melanoma of skin: Secondary | ICD-10-CM | POA: Diagnosis not present

## 2021-05-31 DIAGNOSIS — L819 Disorder of pigmentation, unspecified: Secondary | ICD-10-CM | POA: Diagnosis not present

## 2021-05-31 DIAGNOSIS — D225 Melanocytic nevi of trunk: Secondary | ICD-10-CM | POA: Diagnosis not present

## 2021-05-31 DIAGNOSIS — L814 Other melanin hyperpigmentation: Secondary | ICD-10-CM | POA: Diagnosis not present

## 2021-05-31 DIAGNOSIS — L812 Freckles: Secondary | ICD-10-CM | POA: Diagnosis not present

## 2021-05-31 DIAGNOSIS — Z08 Encounter for follow-up examination after completed treatment for malignant neoplasm: Secondary | ICD-10-CM | POA: Diagnosis not present

## 2021-06-08 DIAGNOSIS — M25531 Pain in right wrist: Secondary | ICD-10-CM | POA: Diagnosis not present

## 2021-06-08 DIAGNOSIS — M0589 Other rheumatoid arthritis with rheumatoid factor of multiple sites: Secondary | ICD-10-CM | POA: Diagnosis not present

## 2021-06-08 DIAGNOSIS — E669 Obesity, unspecified: Secondary | ICD-10-CM | POA: Diagnosis not present

## 2021-06-08 DIAGNOSIS — Z79899 Other long term (current) drug therapy: Secondary | ICD-10-CM | POA: Diagnosis not present

## 2021-06-08 DIAGNOSIS — Z6831 Body mass index (BMI) 31.0-31.9, adult: Secondary | ICD-10-CM | POA: Diagnosis not present

## 2021-06-08 DIAGNOSIS — M2550 Pain in unspecified joint: Secondary | ICD-10-CM | POA: Diagnosis not present

## 2021-07-06 DIAGNOSIS — S40012A Contusion of left shoulder, initial encounter: Secondary | ICD-10-CM | POA: Diagnosis not present

## 2021-07-06 DIAGNOSIS — S2242XA Multiple fractures of ribs, left side, initial encounter for closed fracture: Secondary | ICD-10-CM | POA: Diagnosis not present

## 2021-07-06 DIAGNOSIS — S7002XA Contusion of left hip, initial encounter: Secondary | ICD-10-CM | POA: Diagnosis not present

## 2021-07-06 DIAGNOSIS — S0990XA Unspecified injury of head, initial encounter: Secondary | ICD-10-CM | POA: Diagnosis not present

## 2021-07-08 DIAGNOSIS — R0781 Pleurodynia: Secondary | ICD-10-CM | POA: Diagnosis not present

## 2021-07-19 DIAGNOSIS — H2513 Age-related nuclear cataract, bilateral: Secondary | ICD-10-CM | POA: Diagnosis not present

## 2021-07-19 DIAGNOSIS — H02052 Trichiasis without entropian right lower eyelid: Secondary | ICD-10-CM | POA: Diagnosis not present

## 2021-07-19 DIAGNOSIS — H524 Presbyopia: Secondary | ICD-10-CM | POA: Diagnosis not present

## 2021-07-20 DIAGNOSIS — Z0100 Encounter for examination of eyes and vision without abnormal findings: Secondary | ICD-10-CM | POA: Diagnosis not present

## 2021-07-23 ENCOUNTER — Emergency Department (HOSPITAL_COMMUNITY): Payer: Medicare HMO

## 2021-07-23 ENCOUNTER — Emergency Department (HOSPITAL_COMMUNITY)
Admission: EM | Admit: 2021-07-23 | Discharge: 2021-07-23 | Disposition: A | Discharge status: Home / Self Care | Payer: Medicare HMO | Attending: Emergency Medicine | Admitting: Emergency Medicine

## 2021-07-23 ENCOUNTER — Encounter (HOSPITAL_COMMUNITY): Payer: Self-pay | Admitting: Emergency Medicine

## 2021-07-23 ENCOUNTER — Other Ambulatory Visit: Payer: Self-pay

## 2021-07-23 DIAGNOSIS — I10 Essential (primary) hypertension: Secondary | ICD-10-CM | POA: Diagnosis not present

## 2021-07-23 DIAGNOSIS — Z7982 Long term (current) use of aspirin: Secondary | ICD-10-CM | POA: Insufficient documentation

## 2021-07-23 DIAGNOSIS — Z79899 Other long term (current) drug therapy: Secondary | ICD-10-CM | POA: Diagnosis not present

## 2021-07-23 DIAGNOSIS — G459 Transient cerebral ischemic attack, unspecified: Secondary | ICD-10-CM | POA: Diagnosis not present

## 2021-07-23 DIAGNOSIS — Z23 Encounter for immunization: Secondary | ICD-10-CM | POA: Insufficient documentation

## 2021-07-23 DIAGNOSIS — W19XXXA Unspecified fall, initial encounter: Secondary | ICD-10-CM | POA: Diagnosis not present

## 2021-07-23 DIAGNOSIS — S0990XA Unspecified injury of head, initial encounter: Secondary | ICD-10-CM

## 2021-07-23 DIAGNOSIS — R000 Tachycardia, unspecified: Secondary | ICD-10-CM | POA: Diagnosis not present

## 2021-07-23 DIAGNOSIS — G4489 Other headache syndrome: Secondary | ICD-10-CM | POA: Diagnosis not present

## 2021-07-23 DIAGNOSIS — S0181XA Laceration without foreign body of other part of head, initial encounter: Secondary | ICD-10-CM | POA: Diagnosis not present

## 2021-07-23 DIAGNOSIS — R410 Disorientation, unspecified: Secondary | ICD-10-CM | POA: Diagnosis not present

## 2021-07-23 DIAGNOSIS — G9340 Encephalopathy, unspecified: Secondary | ICD-10-CM | POA: Diagnosis not present

## 2021-07-23 LAB — CBC WITH DIFFERENTIAL/PLATELET
Abs Immature Granulocytes: 0.02 10*3/uL (ref 0.00–0.07)
Basophils Absolute: 0.1 10*3/uL (ref 0.0–0.1)
Basophils Relative: 1 %
Eosinophils Absolute: 0.1 10*3/uL (ref 0.0–0.5)
Eosinophils Relative: 2 %
HCT: 41.7 % (ref 39.0–52.0)
Hemoglobin: 14.3 g/dL (ref 13.0–17.0)
Immature Granulocytes: 0 %
Lymphocytes Relative: 17 %
Lymphs Abs: 1.2 10*3/uL (ref 0.7–4.0)
MCH: 34.3 pg — ABNORMAL HIGH (ref 26.0–34.0)
MCHC: 34.3 g/dL (ref 30.0–36.0)
MCV: 100 fL (ref 80.0–100.0)
Monocytes Absolute: 0.7 10*3/uL (ref 0.1–1.0)
Monocytes Relative: 10 %
Neutro Abs: 4.7 10*3/uL (ref 1.7–7.7)
Neutrophils Relative %: 70 %
Platelets: 192 10*3/uL (ref 150–400)
RBC: 4.17 MIL/uL — ABNORMAL LOW (ref 4.22–5.81)
RDW: 13.8 % (ref 11.5–15.5)
WBC: 6.8 10*3/uL (ref 4.0–10.5)
nRBC: 0 % (ref 0.0–0.2)

## 2021-07-23 LAB — BASIC METABOLIC PANEL
Anion gap: 8 (ref 5–15)
BUN: 11 mg/dL (ref 8–23)
CO2: 26 mmol/L (ref 22–32)
Calcium: 8.7 mg/dL — ABNORMAL LOW (ref 8.9–10.3)
Chloride: 104 mmol/L (ref 98–111)
Creatinine, Ser: 0.95 mg/dL (ref 0.61–1.24)
GFR, Estimated: >60 mL/min (ref >60)
Glucose, Bld: 95 mg/dL (ref 70–99)
Potassium: 3.6 mmol/L (ref 3.5–5.1)
Sodium: 138 mmol/L (ref 135–145)

## 2021-07-23 IMAGING — MR MR HEAD W/O CM
10 of 11 series · 43 of 48 positions shown · non-contrast
Comparison: None.

CLINICAL DATA: encephalopathy

EXAM:
MRI HEAD WITHOUT CONTRAST
TECHNIQUE: Multiplanar, multiecho pulse sequences of the brain and surrounding
structures were obtained without intravenous contrast.

[Series 5: DWI · axial · 3.0mm · 0.88mm/px · z∈[-86,+61]mm · 10 of 100 slices shown (1 of 4)]
[im 1/100]
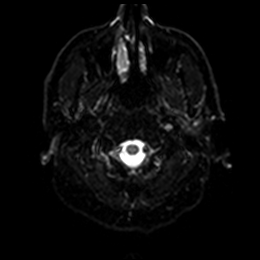
[im 12/100]
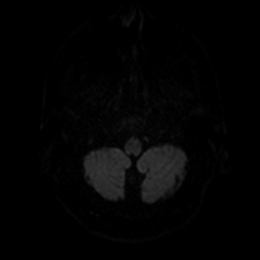
[im 23/100]
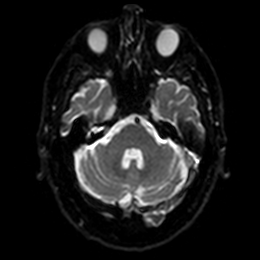
[im 34/100]
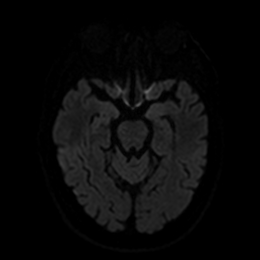
[im 45/100]
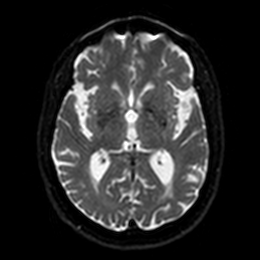
[im 56/100]
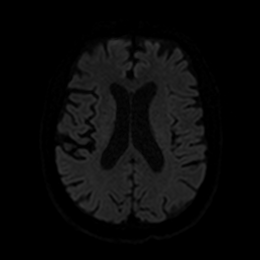
[im 67/100]
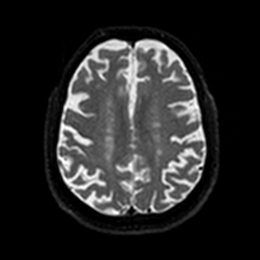
[im 78/100]
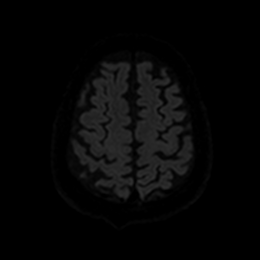
[im 89/100]
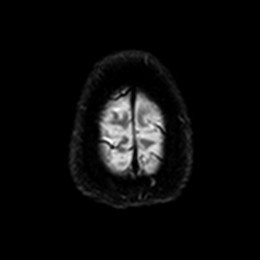
[im 100/100]
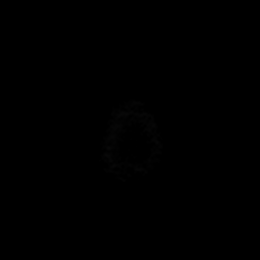

[Series 6: DWI · axial · 3.0mm · 0.88mm/px · z∈[-86,+61]mm · 5 of 50 slices shown (2 of 4)]
[im 1/50]
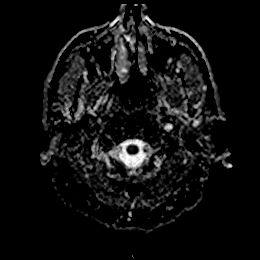
[im 13/50]
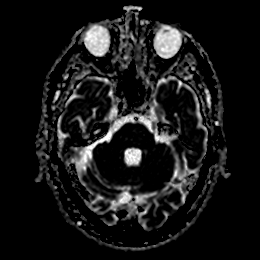
[im 25/50]
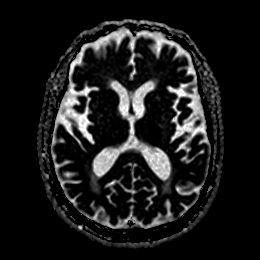
[im 37/50]
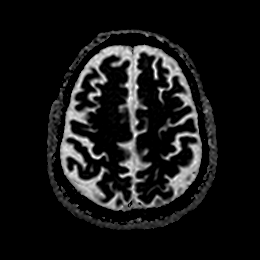
[im 50/50]
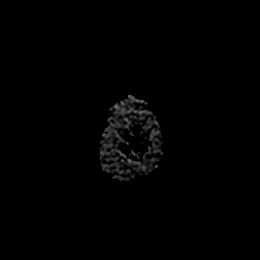

[Series 7: DWI · coronal · 4.0mm · 0.88mm/px · 6 of 66 slices shown (3 of 4)]
[im 1/66]
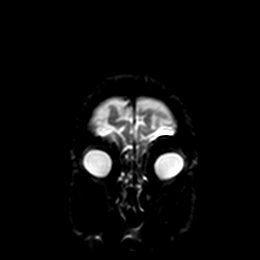
[im 14/66]
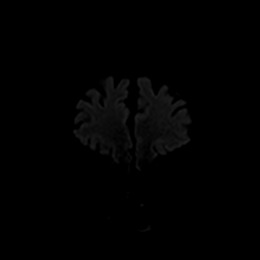
[im 27/66]
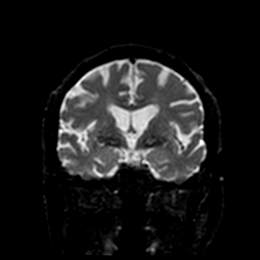
[im 40/66]
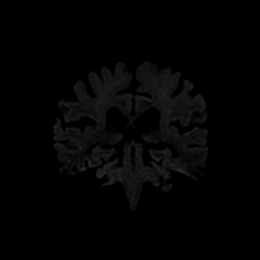
[im 53/66]
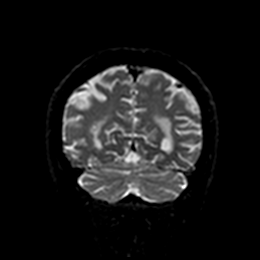
[im 66/66]
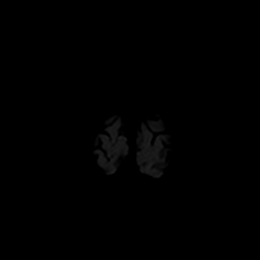

[Series 8: DWI · coronal · 4.0mm · 0.88mm/px · 3 of 33 slices shown (4 of 4)]
[im 1/33]
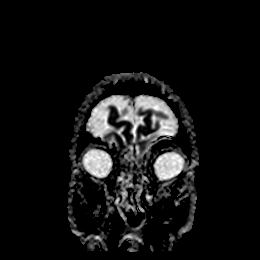
[im 17/33]
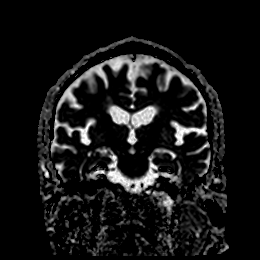
[im 33/33]
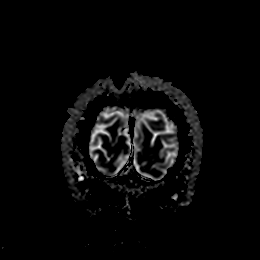

[Series 9: T1 · sagittal · 5.0mm · 0.78mm/px · 2 of 23 slices shown]
[im 1/23]
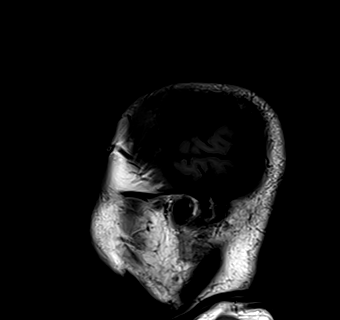
[im 23/23]
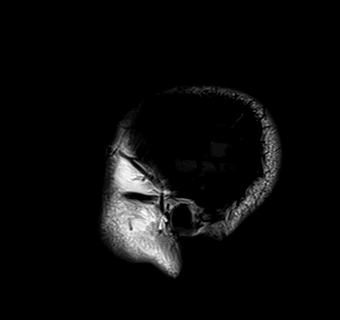

[Series 10: T2 · axial · 5.0mm · 0.72mm/px · z∈[-85,+59]mm · 2 of 25 slices shown (1 of 2)]
[im 1/25]
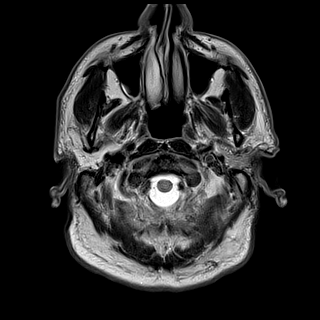
[im 25/25]
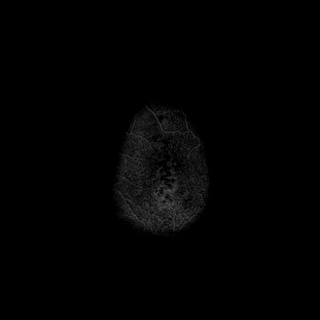

[Series 11: FLAIR · axial · 5.0mm · 0.45mm/px · z∈[-84,+60]mm · 2 of 25 slices shown]
[im 1/25]
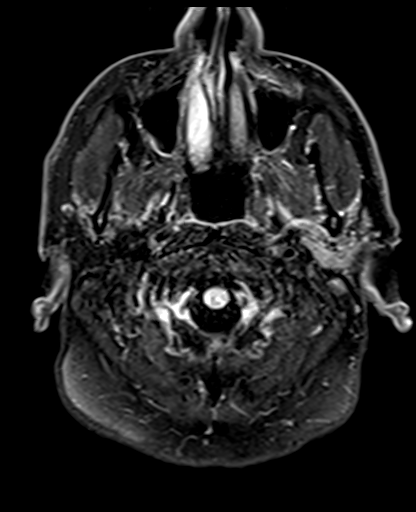
[im 25/25]
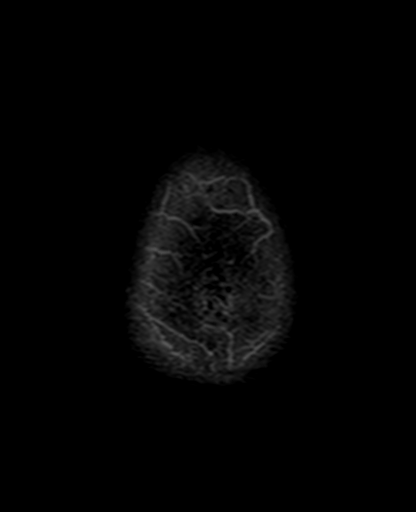

[Series 13: pha_images · axial · 3.0mm · 0.90mm/px · z∈[-100,+76]mm · 5 of 58 slices shown]
[im 1/58]
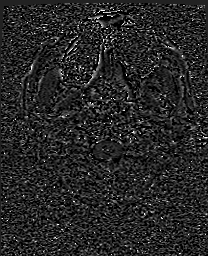
[im 15/58]
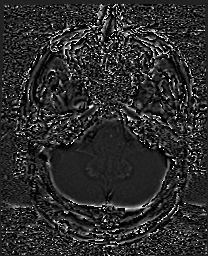
[im 29/58]
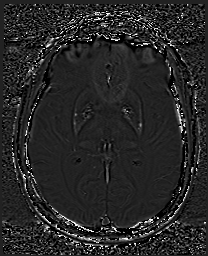
[im 43/58]
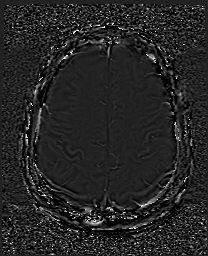
[im 58/58]
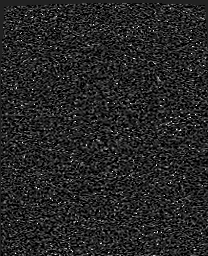

[Series 14: swi_images · axial · 3.0mm · 0.90mm/px · z∈[-100,+76]mm · 5 of 60 slices shown]
[im 1/60]
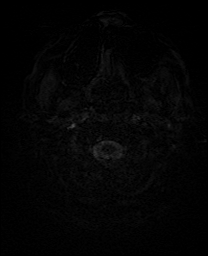
[im 15/60]
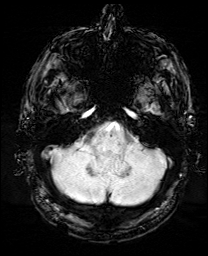
[im 30/60]
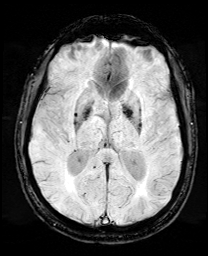
[im 45/60]
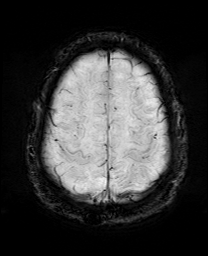
[im 60/60]
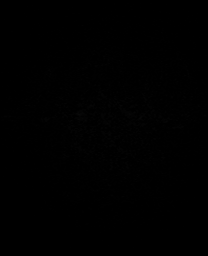

[Series 17: T2 · coronal · 5.0mm · 0.34mm/px · 3 of 29 slices shown (2 of 2)]
[im 1/29]
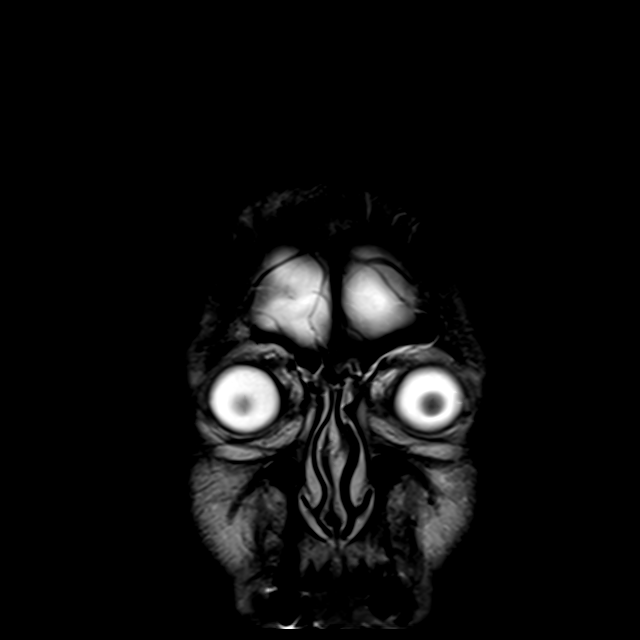
[im 15/29]
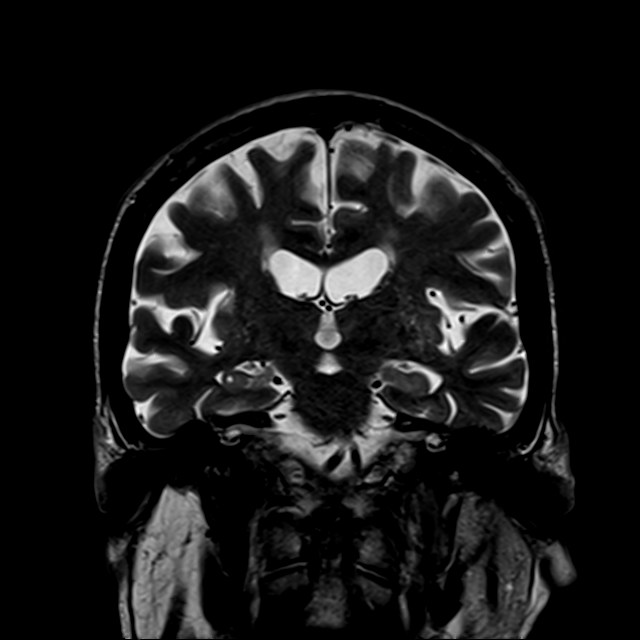
[im 29/29]
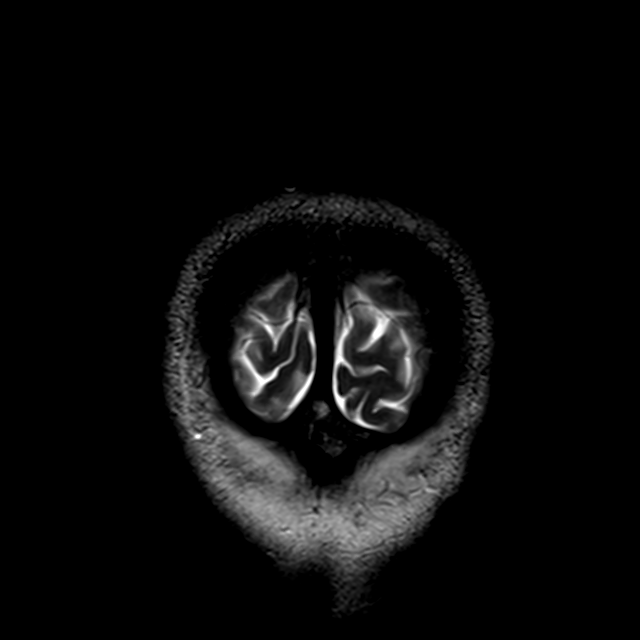

[43 of 48 positions shown; findings below may reference images not displayed]

FINDINGS: Brain: No acute infarct, mass effect or extra-axial collection. No
acute or chronic hemorrhage. There is multifocal hyperintense
T2-weighted signal within the white matter. Generalized volume loss
without a clear lobar predilection. The midline structures are
normal.

Vascular: Major flow voids are preserved.

Skull and upper cervical spine: Normal calvarium and skull base.
Visualized upper cervical spine and soft tissues are normal.

Sinuses/Orbits:No paranasal sinus fluid levels or advanced mucosal
thickening. No mastoid or middle ear effusion. Normal orbits.
IMPRESSION: 1. No acute intracranial abnormality.
2. Generalized volume loss and findings of chronic small vessel
disease.

## 2021-07-23 IMAGING — CT CT HEAD W/O CM
4 series · 15 of 47 positions shown, 17 images · non-contrast
Comparison: None.

CLINICAL DATA: Transient ischemic attack (TIA)



[Series 3: head wo · axial · 0.44mm/px · z∈[-230,-110]mm · 7 of 33 slices shown, 9 images]
[im 5/33  brain]
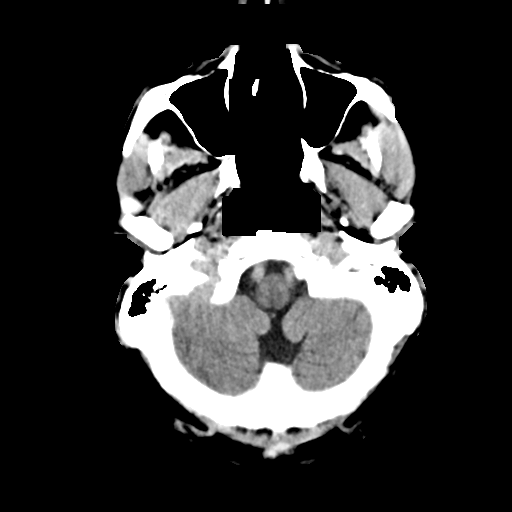
[im 5/33  bone]
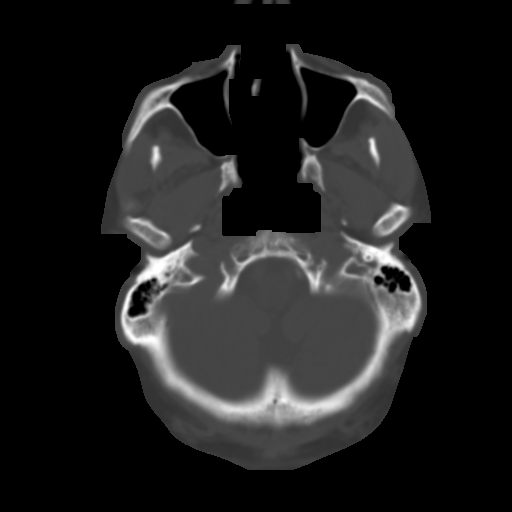
[im 9/33  brain]
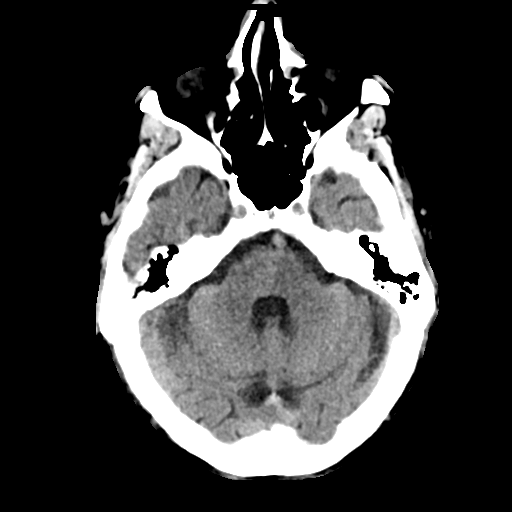
[im 13/33  brain]
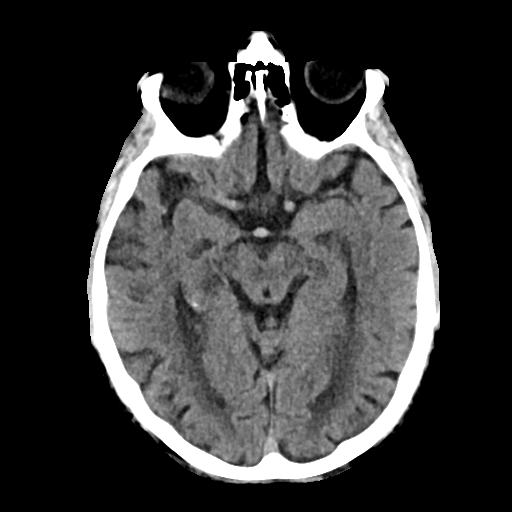
[im 17/33  brain]
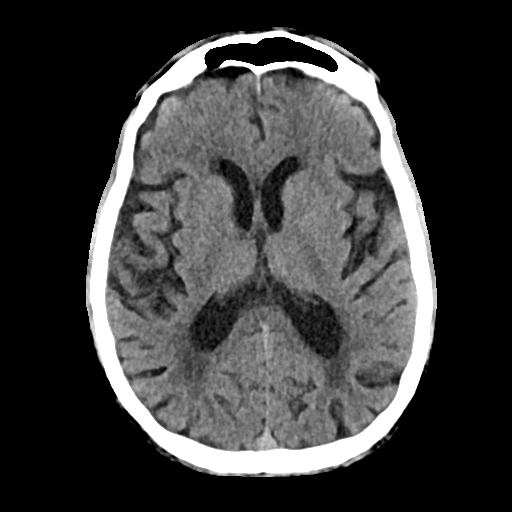
[im 21/33  brain]
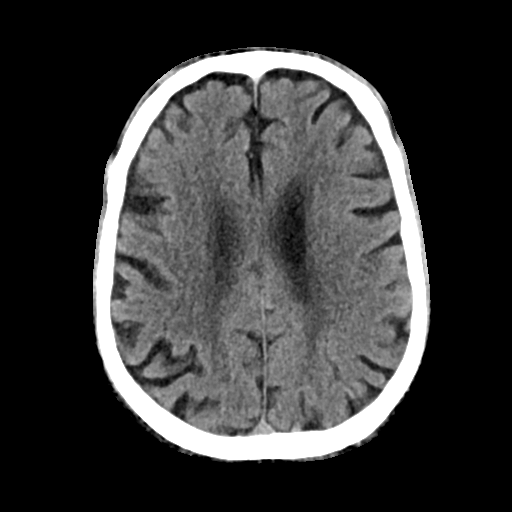
[im 21/33  bone]
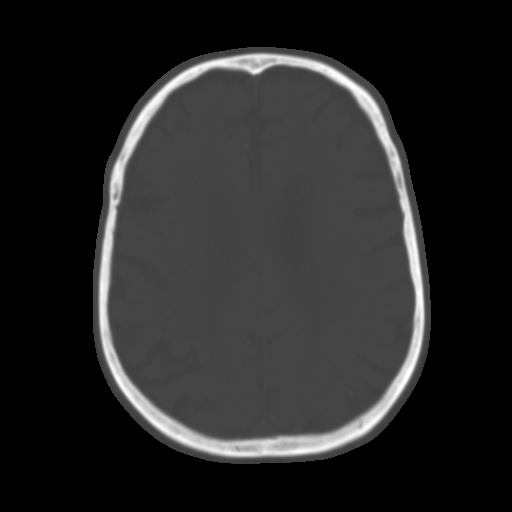
[im 25/33  brain]
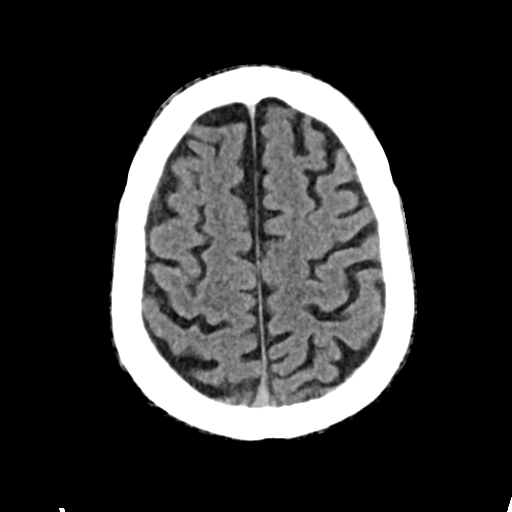
[im 29/33  brain]
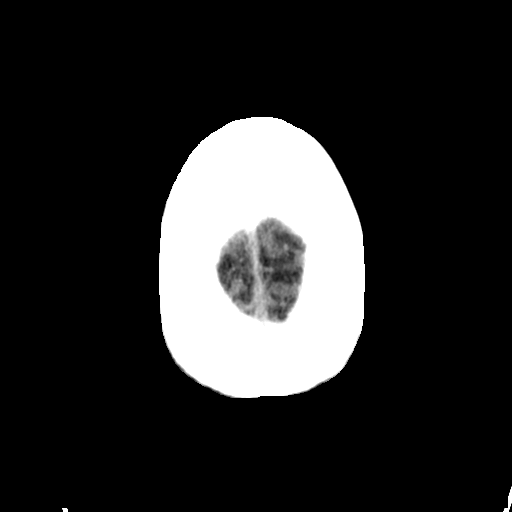

[Series 4: head bone · axial · 0.44mm/px · z∈[-234,-218]mm · 2 of 82 slices shown]
[im 9/82  bone]
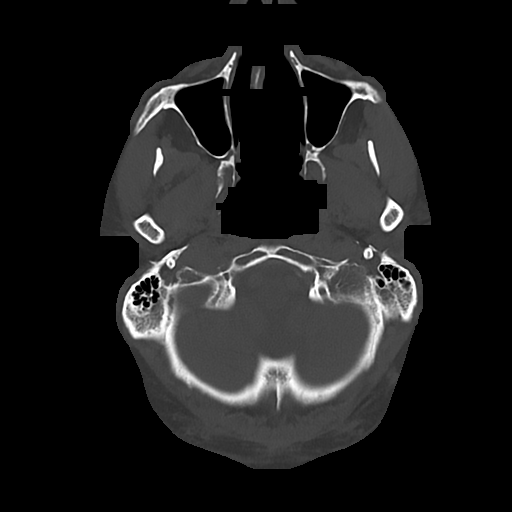
[im 17/82  bone]
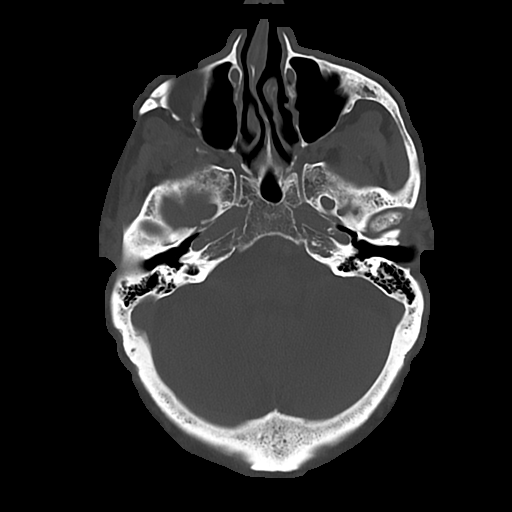

[Series 5: cor soft · coronal · 0.29mm/px · 3 of 77 slices shown]
[im 26/77  brain]
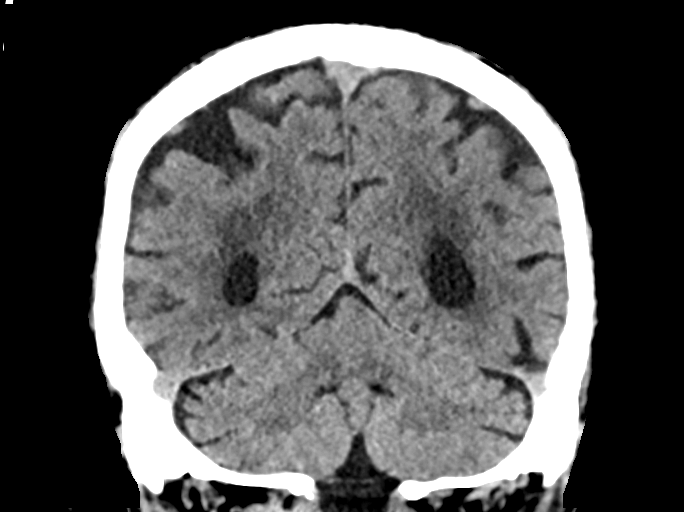
[im 34/77  brain]
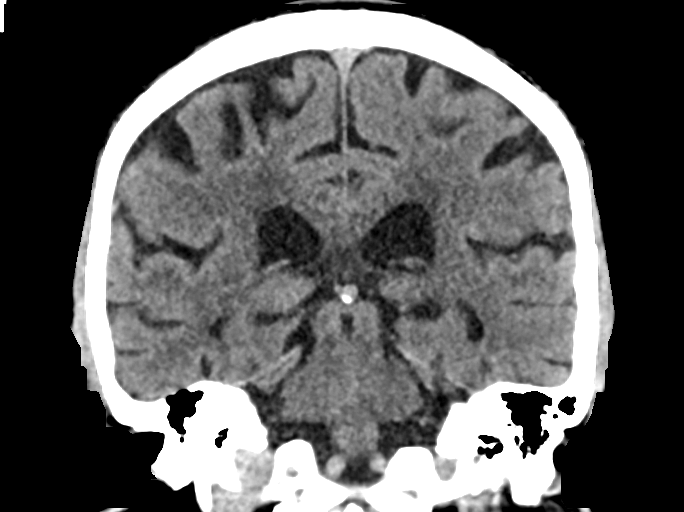
[im 43/77  brain]
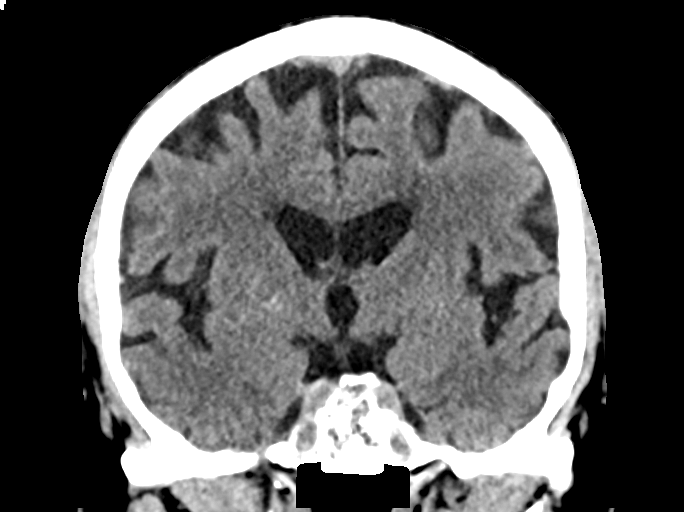

[Series 6: sag soft · sagittal · 0.30mm/px · 3 of 67 slices shown]
[im 23/67  brain]
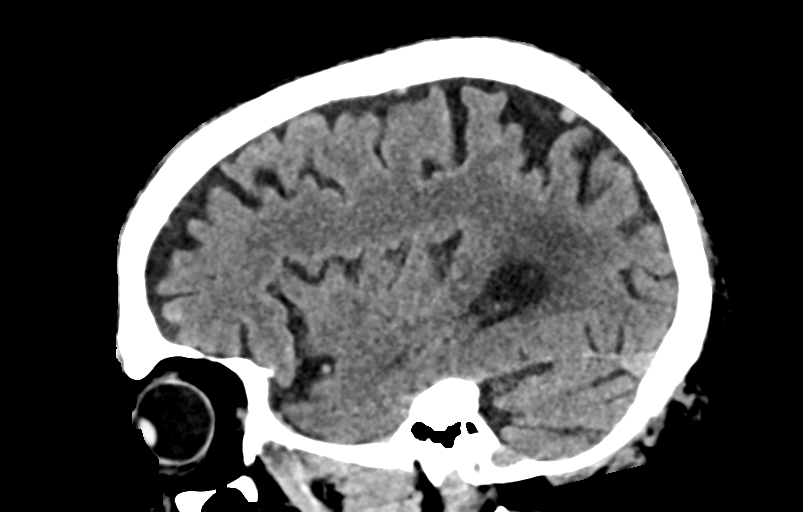
[im 34/67  brain]
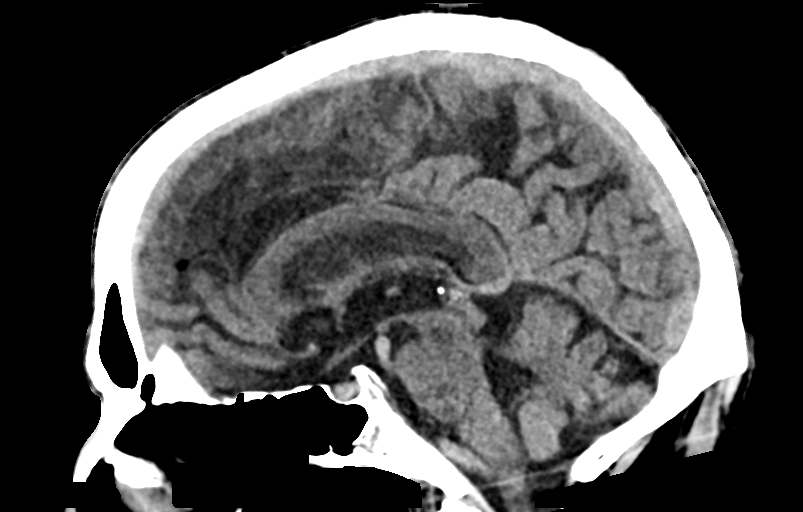
[im 45/67  brain]
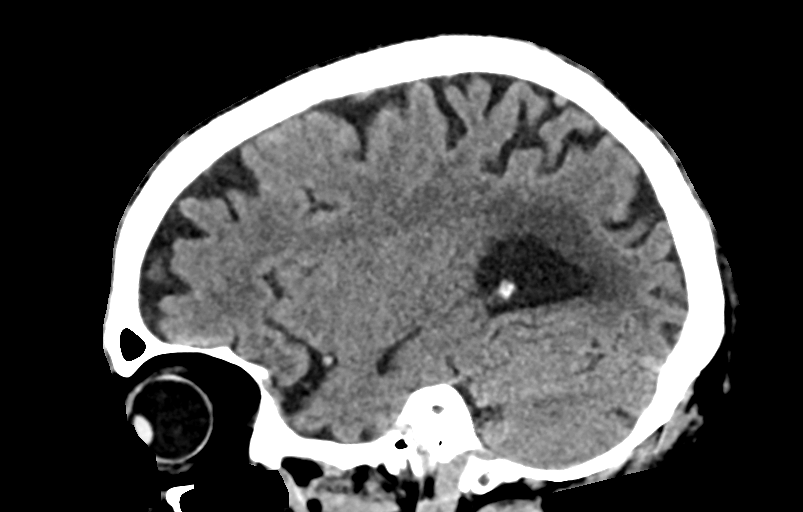

[15 of 47 positions shown; findings below may reference images not displayed]

FINDINGS: Brain: No evidence of acute infarction, hemorrhage, hydrocephalus,
extra-axial collection or mass lesion/mass effect. Periventricular
white matter hypodensities consistent with sequela of chronic
microvascular ischemic disease.

Vascular: Vascular calcifications of the carotid siphons.

Skull: No acute skull fracture.  Osteopenia.

Sinuses/Orbits: Mucosal thickening of the RIGHT maxillary sinus.

Other: Small amount of edema the RIGHT forehead.
IMPRESSION: No acute intracranial abnormality.

## 2021-07-23 MED ORDER — TETANUS-DIPHTH-ACELL PERTUSSIS 5-2.5-18.5 LF-MCG/0.5 IM SUSY
0.5000 mL | PREFILLED_SYRINGE | Freq: Once | INTRAMUSCULAR | Status: AC
  Administered 2021-07-23: 0.5 mL via INTRAMUSCULAR
  Filled 2021-07-23: qty 0.5

## 2021-07-23 MED ORDER — LIDOCAINE-EPINEPHRINE-TETRACAINE (LET) TOPICAL GEL
3.0000 mL | Freq: Once | TOPICAL | Status: AC
  Administered 2021-07-23: 3 mL via TOPICAL
  Filled 2021-07-23: qty 3

## 2021-07-23 MED ORDER — ACETAMINOPHEN 500 MG PO TABS
1000.0000 mg | ORAL_TABLET | Freq: Once | ORAL | Status: AC
  Administered 2021-07-23: 1000 mg via ORAL
  Filled 2021-07-23: qty 2

## 2021-07-23 NOTE — ED Triage Notes (Addendum)
Pt from home via [facility], reports HA, confusion, slurred speech & dexterity issues after hitting head on door frame, abrasion above R eyebrow. Hx fall 2wks ago, rib fx. -LOC, -thinners. Denies dizziness, vomiting  174/100 HR 86 w frequent PVCs CBG 119 18LAC

## 2021-07-23 NOTE — ED Notes (Signed)
Patient transported to MRI 

## 2021-07-23 NOTE — Discharge Instructions (Addendum)
You were seen in the emergency department for evaluation of head injury after an episode of some confusion.  You had a CAT scan and an MRI of your brain along with some lab work that did not show any significant abnormalities.  Your wound was repaired with skin glue and this will dissolve over time.  Tylenol for pain.  Follow-up with your primary care doctor.  You may need to be referred to see neurology.  Return if any worsening or concerning symptoms

## 2021-07-23 NOTE — ED Notes (Signed)
Pt back from MRI and asking for something for his back pain from his rib fractures. Pt states he takes tylenol or ibuprofen for the pain. MD notified and orders received. Will continue to monitor. No acute changes noted. Family at bedside.

## 2021-07-23 NOTE — ED Provider Notes (Signed)
[facility] Provider Note   CSN: [id number] Arrival date & time: 07/23/21  1702     History  No chief complaint on file.   Jimmy Williams is a 81 y.o. male.  He is a history of rheumatoid arthritis.  He said he was at home and he turned quickly in a doorway and ended up striking his head on the door frame.  There was no loss of consciousness.  Few minutes later he was talking with his wife and she noticed that he was confused and his speech was not making any sense.  This lasted about 10 or 15 minutes and resolved by the time EMS got there.  This occurred around an hour ago.  He is complaining of some mild head pain.  Had some blurry vision initially none now.  No numbness or weakness or balance issues.  Wife feels his speech is back to baseline.  No prior history of same.  Did have a fall a few weeks ago which she says he broke a few ribs.  Not taking any painkillers.  The history is provided by the patient.  Head Injury Location:  Frontal Time since incident:  90 minutes Mechanism of injury: direct blow   Pain details:    Quality:  Throbbing   Severity:  Mild   Timing:  Constant   Progression:  Unchanged Chronicity:  New Relieved by:  None tried Worsened by:  Nothing Ineffective treatments:  None tried Associated symptoms: blurred vision and disorientation   Associated symptoms: no focal weakness, no loss of consciousness, no nausea, no neck pain and no vomiting       Home Medications Prior to Admission medications   Medication Sig Start Date End Date Taking? Authorizing Provider  ascorbic acid (VITAMIN C) 1000 MG tablet Take 10,000 mg by mouth daily.      [provider]  aspirin 81 MG chewable tablet Chew 81 mg by mouth daily.      [provider]  B-D TB SYRINGE 1CC/27GX1/2" 27G X 1/2" 1 ML MISC  08/20/18   [provider]  cetirizine (ZYRTEC HIVES RELIEF) 10 MG tablet Take 10 mg by mouth daily.       [provider]  Cholecalciferol (VITAMIN D-1000 MAX ST) 25 MCG (1000 UT) tablet Take by mouth.    [provider]  Coenzyme Q10 50 MG CAPS Take by mouth daily.      [provider]  fish oil-omega-3 fatty acids 1000 MG capsule Take 1,000 mg by mouth daily.     [provider]  folic acid (FOLVITE) 1 MG tablet Take 1 mg by mouth daily. Methotrexate day     [provider]  losartan (COZAAR) 100 MG tablet Take by mouth.    [provider]  methotrexate (RHEUMATREX) 2.5 MG tablet once a week. Take 8 tablets once a week. Caution:Chemotherapy. Protect from light.    [provider]  Multiple Vitamin (MULTIVITAMIN) capsule Take 1 capsule by mouth daily.      [provider]  Resveratrol 100 MG CAPS Take by mouth.    [provider]  sulfaSALAzine (AZULFIDINE) 500 MG tablet Take 500 mg by mouth daily.      [provider]  vitamin E (NATURAL VITAMIN E) 400 UNIT capsule Take 400 Units by mouth daily.      [provider]      Allergies    Patient has no known allergies.  Review of Systems   Review of Systems  Constitutional:  Negative for fever.  HENT:  Negative for sore throat.   Eyes:  Positive for blurred vision and visual disturbance.  Respiratory:  Negative for shortness of breath.   Cardiovascular:  Negative for chest pain.  Gastrointestinal:  Negative for abdominal pain, nausea and vomiting.  Genitourinary:  Negative for dysuria.  Musculoskeletal:  Negative for neck pain.  Skin:  Positive for wound. Negative for rash.  Neurological:  Positive for speech difficulty. Negative for focal weakness and loss of consciousness.   Physical Exam Updated Vital Signs BP (!) 154/87    Pulse 92    Temp 98.5 F (36.9 C) (Oral)    Resp 16    Ht 5\' 6"  (1.676 m)    Wt 83.5 kg    SpO2 96%    BMI 29.70 kg/m  Physical Exam Vitals and nursing note reviewed.  Constitutional:      General: He is not in  acute distress.    Appearance: Normal appearance. He is well-developed.  HENT:     Head: Normocephalic.     Comments: Has proximal a 1 cm laceration on his forehead. Eyes:     Conjunctiva/sclera: Conjunctivae normal.  Cardiovascular:     Rate and Rhythm: Normal rate and regular rhythm.     Heart sounds: No murmur heard. Pulmonary:     Effort: Pulmonary effort is normal. No respiratory distress.     Breath sounds: Normal breath sounds.  Abdominal:     Palpations: Abdomen is soft.     Tenderness: There is no abdominal tenderness.  Musculoskeletal:        General: No swelling.     Cervical back: Neck supple.  Skin:    General: Skin is warm and dry.     Capillary Refill: Capillary refill takes less than 2 seconds.  Neurological:     General: No focal deficit present.     Mental Status: He is alert and oriented to person, place, and time.     Cranial Nerves: No cranial nerve deficit.     Sensory: No sensory deficit.     Motor: No weakness.     Gait: Gait normal.    ED Results / Procedures / Treatments   Labs (all labs ordered are listed, but only abnormal results are displayed) Labs Reviewed  BASIC METABOLIC PANEL - Abnormal; Notable for the following components:      Result Value   Calcium 8.7 (*)    All other components within normal limits  CBC WITH DIFFERENTIAL/PLATELET - Abnormal; Notable for the following components:   RBC 4.17 (*)    MCH 34.3 (*)    All other components within normal limits    EKG EKG Interpretation  Date/Time:  Saturday July 23 2021 17:17:46 EST Ventricular Rate:  106 PR Interval:  169 QRS Duration: 86 QT Interval:  337 QTC Calculation: 448 R Axis:   -29 Text Interpretation: Sinus tachycardia Borderline left axis deviation No old tracing to compare Confirmed by Aletta Edouard [phone]) on 07/23/2021 5:20:51 PM  Radiology CT Head Wo Contrast  Result Date: 07/23/2021 CLINICAL DATA:  Transient ischemic attack (TIA) EXAM: CT HEAD WITHOUT  CONTRAST TECHNIQUE: Contiguous axial images were obtained from the base of the skull through the vertex without intravenous contrast. RADIATION DOSE REDUCTION: This exam was performed according to the departmental dose-optimization program which includes automated exposure control, adjustment of the mA and/or kV according to patient size and/or use of iterative  reconstruction technique. COMPARISON:  None. FINDINGS: Brain: No evidence of acute infarction, hemorrhage, hydrocephalus, extra-axial collection or mass lesion/mass effect. Periventricular white matter hypodensities consistent with sequela of chronic microvascular ischemic disease. Vascular: Vascular calcifications of the carotid siphons. Skull: No acute skull fracture.  Osteopenia. Sinuses/Orbits: Mucosal thickening of the RIGHT maxillary sinus. Other: Small amount of edema the RIGHT forehead. IMPRESSION: No acute intracranial abnormality. Electronically Signed   By: Valentino Saxon M.D.   On: 07/23/2021 17:45   MR BRAIN WO CONTRAST  Result Date: 07/23/2021 CLINICAL DATA:  encephalopathy EXAM: MRI HEAD WITHOUT CONTRAST TECHNIQUE: Multiplanar, multiecho pulse sequences of the brain and surrounding structures were obtained without intravenous contrast. COMPARISON:  None. FINDINGS: Brain: No acute infarct, mass effect or extra-axial collection. No acute or chronic hemorrhage. There is multifocal hyperintense T2-weighted signal within the white matter. Generalized volume loss without a clear lobar predilection. The midline structures are normal. Vascular: Major flow voids are preserved. Skull and upper cervical spine: Normal calvarium and skull base. Visualized upper cervical spine and soft tissues are normal. Sinuses/Orbits:No paranasal sinus fluid levels or advanced mucosal thickening. No mastoid or middle ear effusion. Normal orbits. IMPRESSION: 1. No acute intracranial abnormality. 2. Generalized volume loss and findings of chronic small vessel  disease. Electronically Signed   By: Ulyses Jarred M.D.   On: 07/23/2021 20:26    Procedures .Marland KitchenLaceration Repair  Date/Time: 07/23/2021 6:18 PM Performed by: Hayden Rasmussen, MD Authorized by: Hayden Rasmussen, MD   Consent:    Consent obtained:  Verbal   Consent given by:  Patient   Risks discussed:  Infection, pain, poor cosmetic result, poor wound healing and retained foreign body   Alternatives discussed:  No treatment and delayed treatment Universal protocol:    Procedure explained and questions answered to patient or proxy's satisfaction: yes     Patient identity confirmed:  Verbally with patient Anesthesia:    Anesthesia method:  Topical application   Topical anesthetic:  LET Laceration details:    Location:  Face   Face location:  Forehead   Length (cm):  1 Treatment:    Area cleansed with:  Saline   Amount of cleaning:  Standard Skin repair:    Repair method:  Tissue adhesive Approximation:    Approximation:  Close Repair type:    Repair type:  Simple Post-procedure details:    Dressing:  Open (no dressing)   Procedure completion:  Tolerated well, no immediate complications    Medications Ordered in ED Medications - No data to display  ED Course/ Medical Decision Making/ A&P Clinical Course as of 07/24/21 0816  Sat Jul 23, 2021  1818 Curb sided neurology Dr. Rory Percy.  He is recommending an MRI.  If MRI negative then can follow-up as outpatient.  Reviewed this with patient and wife.  They are comfortable with plan. [MB]    Clinical Course User Index [MB] Hayden Rasmussen, MD                           Medical Decision Making Amount and/or Complexity of Data Reviewed Labs: ordered. Radiology: ordered.  Risk OTC drugs. Prescription drug management.  This patient complains of fall and head injury, episode of confusion and word finding difficulties; this involves an extensive number of treatment Options and is a complaint that carries with it a high  risk of complications and Morbidity. The differential includes concussion, stroke, bleed, fracture, contusion, laceration  I ordered, reviewed  and interpreted labs, which included CBC with normal white count normal hemoglobin, chemistries fairly unremarkable I ordered medication oral Tylenol for pain, let gel for laceration repair I ordered imaging studies which included CT head and MRI brain and I independently    visualized and interpreted imaging which showed no acute findings Additional history obtained from patient's wife Previous records obtained and reviewed in epic no recent admissions I consulted neurology Dr. Rory Percy And discussed lab and imaging findings  Critical Interventions: None  After the interventions stated above, I reevaluated the patient and found patient back to baseline.  MRI not showing any evidence of stroke.  Admission considered regarding patient's neurologic symptoms.  Currently is asymptomatic and so feel he can be adequately evaluated as outpatient.  Return instructions discussed          Final Clinical Impression(s) / ED Diagnoses Final diagnoses:  Closed head injury, initial encounter  Laceration of forehead, initial encounter  Episode of confusion    Rx / DC Orders ED Discharge Orders     None         Hayden Rasmussen, MD 07/24/21 [phone]

## 2021-08-04 ENCOUNTER — Observation Stay (HOSPITAL_COMMUNITY): Payer: Medicare HMO

## 2021-08-04 ENCOUNTER — Observation Stay (HOSPITAL_COMMUNITY)
Admission: EM | Admit: 2021-08-04 | Discharge: 2021-08-05 | Disposition: A | Discharge status: Home / Self Care | Payer: Medicare HMO | Attending: Internal Medicine | Admitting: Internal Medicine

## 2021-08-04 ENCOUNTER — Other Ambulatory Visit: Payer: Self-pay

## 2021-08-04 ENCOUNTER — Encounter (HOSPITAL_COMMUNITY): Payer: Self-pay

## 2021-08-04 DIAGNOSIS — Z20822 Contact with and (suspected) exposure to covid-19: Secondary | ICD-10-CM | POA: Insufficient documentation

## 2021-08-04 DIAGNOSIS — R4789 Other speech disturbances: Secondary | ICD-10-CM | POA: Diagnosis not present

## 2021-08-04 DIAGNOSIS — M069 Rheumatoid arthritis, unspecified: Secondary | ICD-10-CM | POA: Diagnosis not present

## 2021-08-04 DIAGNOSIS — I10 Essential (primary) hypertension: Secondary | ICD-10-CM | POA: Diagnosis not present

## 2021-08-04 DIAGNOSIS — R2681 Unsteadiness on feet: Secondary | ICD-10-CM | POA: Insufficient documentation

## 2021-08-04 DIAGNOSIS — Z87891 Personal history of nicotine dependence: Secondary | ICD-10-CM | POA: Diagnosis not present

## 2021-08-04 DIAGNOSIS — R0902 Hypoxemia: Secondary | ICD-10-CM | POA: Diagnosis not present

## 2021-08-04 DIAGNOSIS — Z7982 Long term (current) use of aspirin: Secondary | ICD-10-CM | POA: Diagnosis not present

## 2021-08-04 DIAGNOSIS — I672 Cerebral atherosclerosis: Secondary | ICD-10-CM | POA: Diagnosis not present

## 2021-08-04 DIAGNOSIS — R4781 Slurred speech: Secondary | ICD-10-CM | POA: Diagnosis present

## 2021-08-04 DIAGNOSIS — G459 Transient cerebral ischemic attack, unspecified: Principal | ICD-10-CM | POA: Insufficient documentation

## 2021-08-04 DIAGNOSIS — E042 Nontoxic multinodular goiter: Secondary | ICD-10-CM | POA: Diagnosis not present

## 2021-08-04 DIAGNOSIS — M47812 Spondylosis without myelopathy or radiculopathy, cervical region: Secondary | ICD-10-CM | POA: Diagnosis not present

## 2021-08-04 DIAGNOSIS — Z79899 Other long term (current) drug therapy: Secondary | ICD-10-CM | POA: Insufficient documentation

## 2021-08-04 DIAGNOSIS — I6523 Occlusion and stenosis of bilateral carotid arteries: Secondary | ICD-10-CM | POA: Diagnosis not present

## 2021-08-04 DIAGNOSIS — Z743 Need for continuous supervision: Secondary | ICD-10-CM | POA: Diagnosis not present

## 2021-08-04 HISTORY — DX: Essential (primary) hypertension: I10

## 2021-08-04 LAB — URINALYSIS, ROUTINE W REFLEX MICROSCOPIC
Bilirubin Urine: NEGATIVE
Glucose, UA: NEGATIVE mg/dL
Hgb urine dipstick: NEGATIVE
Ketones, ur: NEGATIVE mg/dL
Leukocytes,Ua: NEGATIVE
Nitrite: NEGATIVE
Protein, ur: NEGATIVE mg/dL
Specific Gravity, Urine: 1.006 (ref 1.005–1.030)
pH: 7 (ref 5.0–8.0)

## 2021-08-04 LAB — I-STAT CHEM 8, ED
BUN: 16 mg/dL (ref 8–23)
Calcium, Ion: 1.1 mmol/L — ABNORMAL LOW (ref 1.15–1.40)
Chloride: 103 mmol/L (ref 98–111)
Creatinine, Ser: 0.8 mg/dL (ref 0.61–1.24)
Glucose, Bld: 88 mg/dL (ref 70–99)
HCT: 47 % (ref 39.0–52.0)
Hemoglobin: 16 g/dL (ref 13.0–17.0)
Potassium: 4 mmol/L (ref 3.5–5.1)
Sodium: 140 mmol/L (ref 135–145)
TCO2: 27 mmol/L (ref 22–32)

## 2021-08-04 LAB — RESP PANEL BY RT-PCR (FLU A&B, COVID) ARPGX2
Influenza A by PCR: NEGATIVE
Influenza B by PCR: NEGATIVE
SARS Coronavirus 2 by RT PCR: NEGATIVE

## 2021-08-04 LAB — COMPREHENSIVE METABOLIC PANEL
ALT: 46 U/L — ABNORMAL HIGH (ref 0–44)
AST: 41 U/L (ref 15–41)
Albumin: 4.1 g/dL (ref 3.5–5.0)
Alkaline Phosphatase: 74 U/L (ref 38–126)
Anion gap: 9 (ref 5–15)
BUN: 15 mg/dL (ref 8–23)
CO2: 26 mmol/L (ref 22–32)
Calcium: 8.9 mg/dL (ref 8.9–10.3)
Chloride: 102 mmol/L (ref 98–111)
Creatinine, Ser: 0.89 mg/dL (ref 0.61–1.24)
GFR, Estimated: >60 mL/min (ref >60)
Glucose, Bld: 91 mg/dL (ref 70–99)
Potassium: 3.9 mmol/L (ref 3.5–5.1)
Sodium: 137 mmol/L (ref 135–145)
Total Bilirubin: 0.5 mg/dL (ref 0.3–1.2)
Total Protein: 7.1 g/dL (ref 6.5–8.1)

## 2021-08-04 LAB — RAPID URINE DRUG SCREEN, HOSP PERFORMED
Amphetamines: NOT DETECTED
Barbiturates: NOT DETECTED
Benzodiazepines: NOT DETECTED
Cocaine: NOT DETECTED
Opiates: NOT DETECTED
Tetrahydrocannabinol: NOT DETECTED

## 2021-08-04 LAB — PROTIME-INR
INR: 1 (ref 0.8–1.2)
Prothrombin Time: 13 seconds (ref 11.4–15.2)

## 2021-08-04 LAB — CBC
HCT: 44.1 % (ref 39.0–52.0)
Hemoglobin: 15.1 g/dL (ref 13.0–17.0)
MCH: 34.2 pg — ABNORMAL HIGH (ref 26.0–34.0)
MCHC: 34.2 g/dL (ref 30.0–36.0)
MCV: 100 fL (ref 80.0–100.0)
Platelets: 185 10*3/uL (ref 150–400)
RBC: 4.41 MIL/uL (ref 4.22–5.81)
RDW: 13.9 % (ref 11.5–15.5)
WBC: 6.9 10*3/uL (ref 4.0–10.5)
nRBC: 0 % (ref 0.0–0.2)

## 2021-08-04 LAB — LIPID PANEL
Cholesterol: 183 mg/dL (ref 0–200)
HDL: 63 mg/dL (ref >40)
LDL Cholesterol: 103 mg/dL — ABNORMAL HIGH (ref 0–99)
Total CHOL/HDL Ratio: 2.9 RATIO
Triglycerides: 86 mg/dL (ref <150)
VLDL: 17 mg/dL (ref 0–40)

## 2021-08-04 LAB — HEMOGLOBIN A1C
Hgb A1c MFr Bld: 4.5 % — ABNORMAL LOW (ref 4.8–5.6)
Mean Plasma Glucose: 82.45 mg/dL

## 2021-08-04 IMAGING — MR MR HEAD W/O CM
12 of 13 series · 44 of 48 positions shown · non-contrast
Comparison: Prior CTA from [DATE] and brain MRI from
[DATE].

CLINICAL DATA: Initial evaluation for neuro deficit, stroke
suspected.

EXAM:
MRI HEAD WITHOUT CONTRAST
TECHNIQUE: Multiplanar, multiecho pulse sequences of the brain and surrounding
structures were obtained without intravenous contrast.

[Series 5: DWI · axial · 3.0mm · 0.96mm/px · z∈[-109,+56]mm · 8 of 112 slices shown (1 of 4)]
[im 1/112]
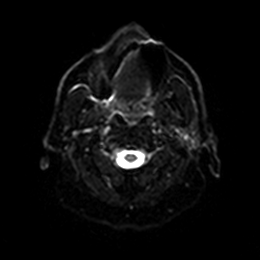
[im 16/112]
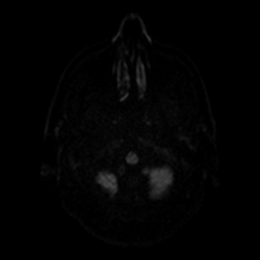
[im 32/112]
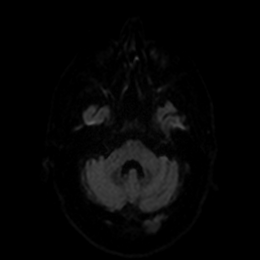
[im 48/112]
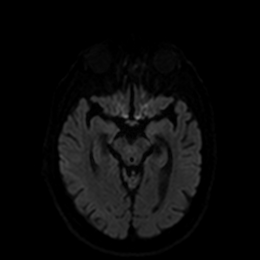
[im 64/112]
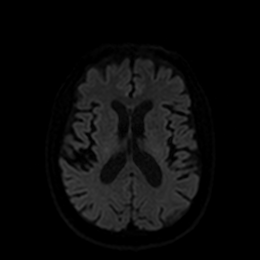
[im 80/112]
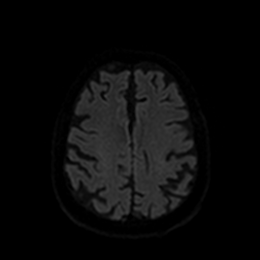
[im 96/112]
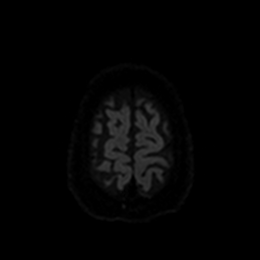
[im 112/112]
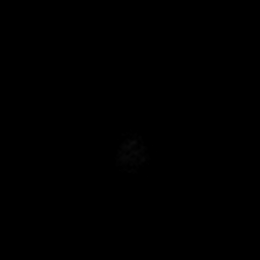

[Series 6: DWI · axial · 3.0mm · 0.96mm/px · z∈[-109,+56]mm · 4 of 56 slices shown (2 of 4)]
[im 1/56]
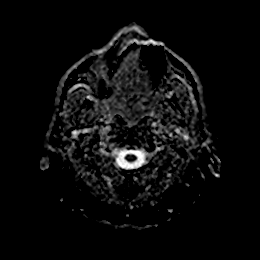
[im 19/56]
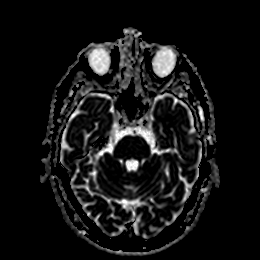
[im 37/56]
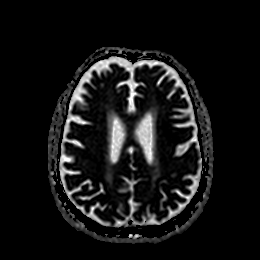
[im 56/56]
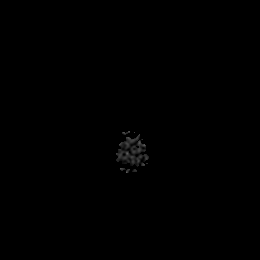

[Series 7: DWI · coronal · 4.0mm · 0.88mm/px · 6 of 86 slices shown (3 of 4)]
[im 1/86]
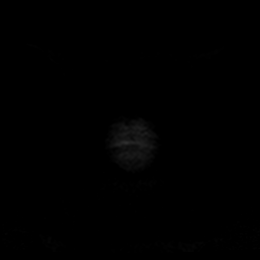
[im 18/86]
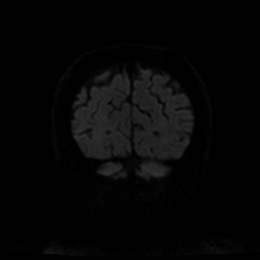
[im 35/86]
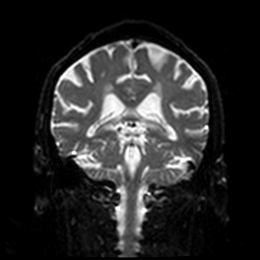
[im 52/86]
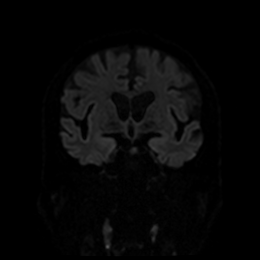
[im 69/86]
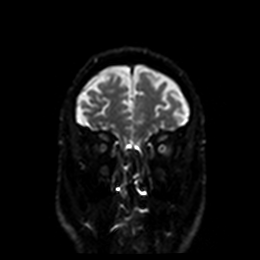
[im 86/86]
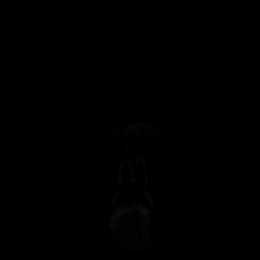

[Series 8: DWI · coronal · 4.0mm · 0.88mm/px · 3 of 43 slices shown (4 of 4)]
[im 1/43]
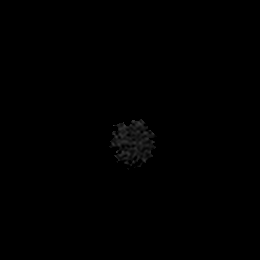
[im 22/43]
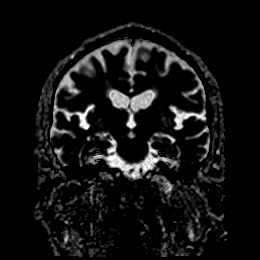
[im 43/43]
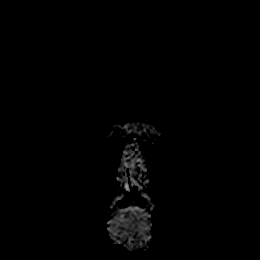

[Series 9: T1 · sagittal · 5.0mm · 0.78mm/px · 2 of 30 slices shown]
[im 1/30]
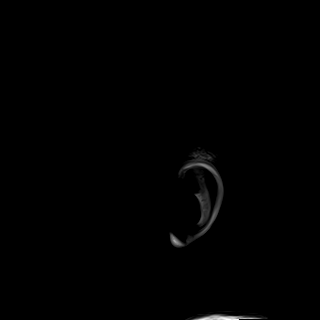
[im 30/30]
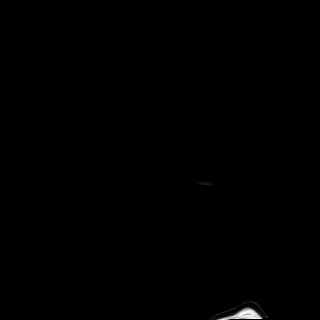

[Series 10: T2 · axial · 5.0mm · 0.78mm/px · z∈[-114,+60]mm · 2 of 30 slices shown (1 of 2)]
[im 1/30]
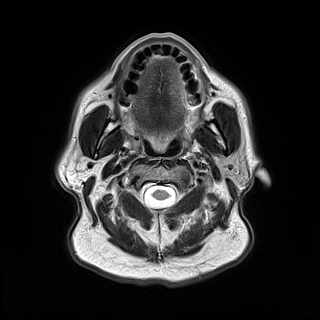
[im 30/30]
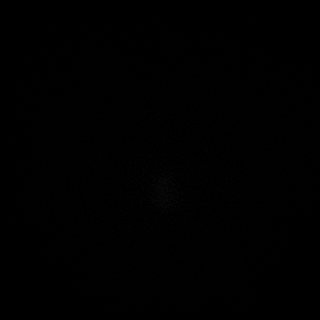

[Series 11: FLAIR · axial · 5.0mm · 0.49mm/px · z∈[-113,+61]mm · 2 of 30 slices shown]
[im 1/30]
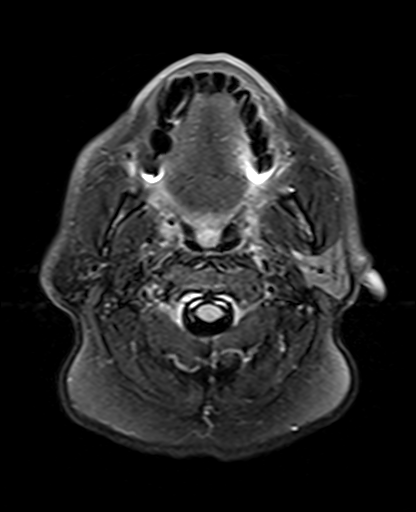
[im 30/30]
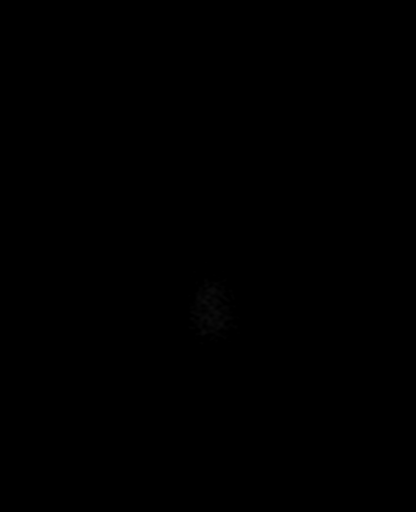

[Series 12: mag_images · axial · 3.0mm · 0.98mm/px · z∈[-107,+58]mm · 4 of 56 slices shown]
[im 1/56]
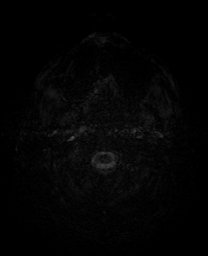
[im 19/56]
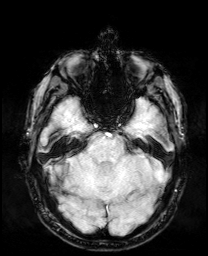
[im 37/56]
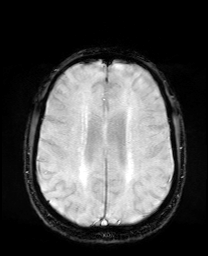
[im 56/56]
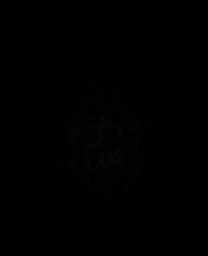

[Series 13: pha_images · axial · 3.0mm · 0.98mm/px · z∈[-107,+58]mm · 4 of 55 slices shown]
[im 1/55]
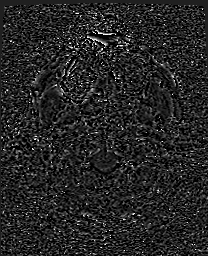
[im 19/55]
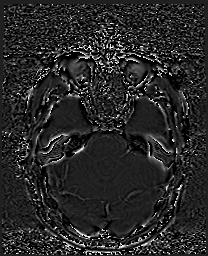
[im 37/55]
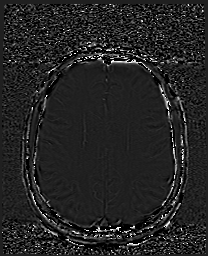
[im 55/55]
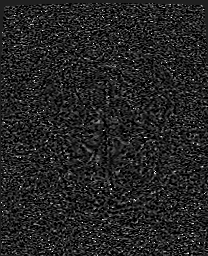

[Series 14: swi_images · axial · 3.0mm · 0.98mm/px · z∈[-107,+58]mm · 4 of 56 slices shown]
[im 1/56]
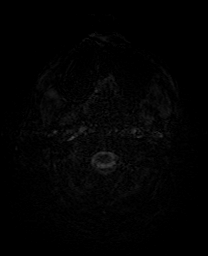
[im 19/56]
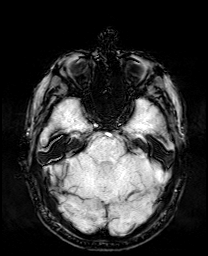
[im 37/56]
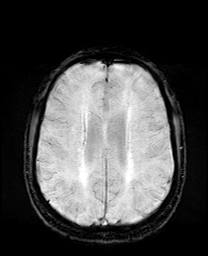
[im 56/56]
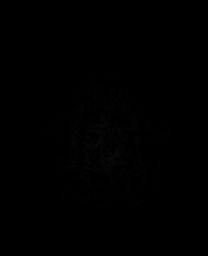

[Series 15: mip_images(sw) · axial · 24.0mm · 0.98mm/px · z∈[-97,+47]mm · 3 of 49 slices shown]
[im 1/49]
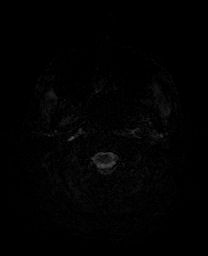
[im 25/49]
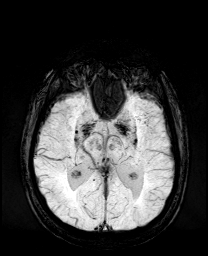
[im 49/49]
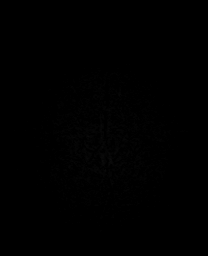

[Series 17: T2 · coronal · 5.0mm · 0.34mm/px · 2 of 36 slices shown (2 of 2)]
[im 1/36]
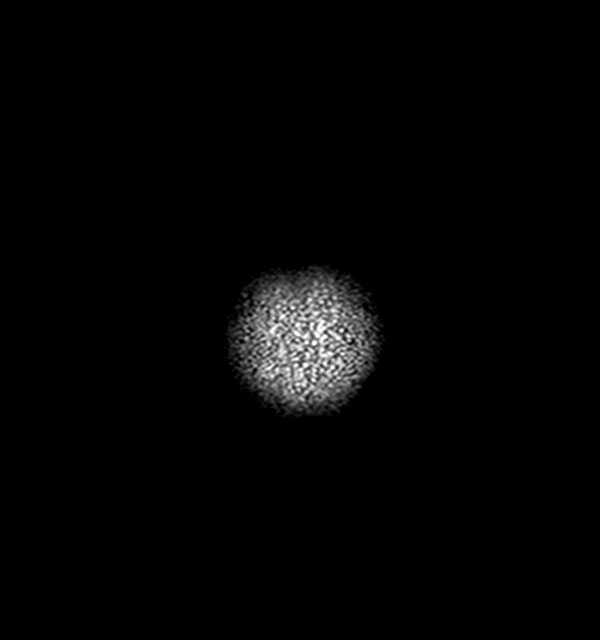
[im 36/36]
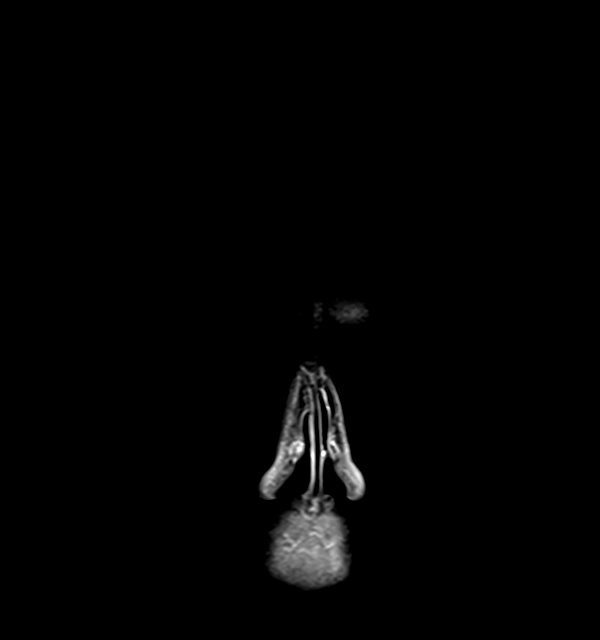

[44 of 48 positions shown; findings below may reference images not displayed]

FINDINGS: Brain: Mild age-related cerebral atrophy with chronic small vessel
ischemic disease, stable. No abnormal foci of restricted diffusion
to suggest acute or subacute ischemia. Gray-white matter
differentiation maintained. No areas of chronic cortical infarction.
No acute intracranial hemorrhage. Single punctate chronic
microhemorrhage noted at the right splenium, of doubtful
significance in isolation.

No mass lesion, midline shift or mass effect no hydrocephalus or
extra-axial fluid collection. Pituitary gland suprasellar region
normal. Midline structures intact.

Vascular: Major intracranial vascular flow voids are well
maintained.

Skull and upper cervical spine: Craniocervical junction within
normal limits. Bone marrow signal intensity mildly heterogeneous but
overall within normal limits. No scalp soft tissue abnormality.

Sinuses/Orbits: Globes normal soft tissues demonstrate no acute
finding. Mild scattered mucosal thickening noted within the
ethmoidal air cells and maxillary sinuses. No mastoid effusion.
Inner ear structures grossly normal.

Other: Chronic fatty atrophy of the visualized right parotid gland
noted.
IMPRESSION: 1. No acute intracranial abnormality.
2. Mild age-related cerebral atrophy with chronic small vessel
ischemic disease, stable.

## 2021-08-04 IMAGING — CT CT ANGIO HEAD-NECK (W OR W/O PERF)
1 of 11 series · 14 of 47 positions shown · non-contrast
Comparison: Prior study from [DATE]

CLINICAL DATA: Initial evaluation for acute TIA.

EXAM:
CT ANGIOGRAPHY HEAD AND NECK
TECHNIQUE: Multidetector CT imaging of the head and neck was performed using
the standard protocol during bolus administration of intravenous
contrast. Multiplanar CT image reconstructions and MIPs were
obtained to evaluate the vascular anatomy. Carotid stenosis
measurements (when applicable) are obtained utilizing NASCET
criteria, using the distal internal carotid diameter as the
denominator.

[Series 8: thin · axial · 0.51mm/px · z∈[-291,+6]mm · 14 of 686 slices shown]
[im 46/686  brain]
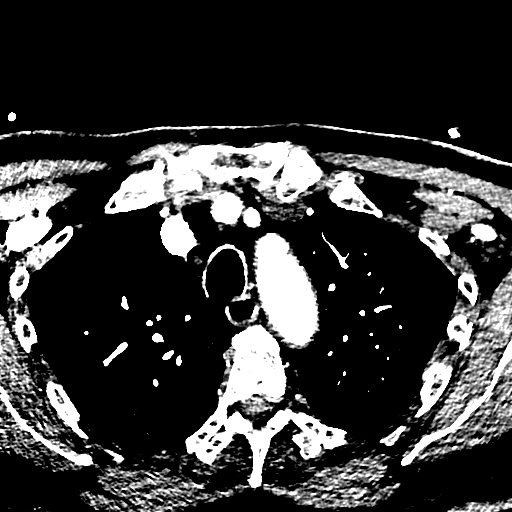
[im 92/686  bone]
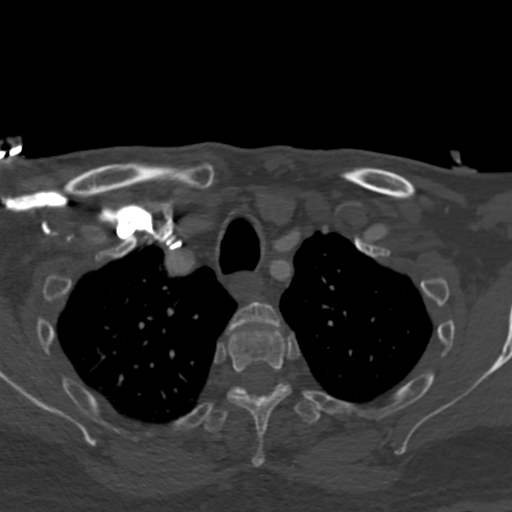
[im 138/686  brain]
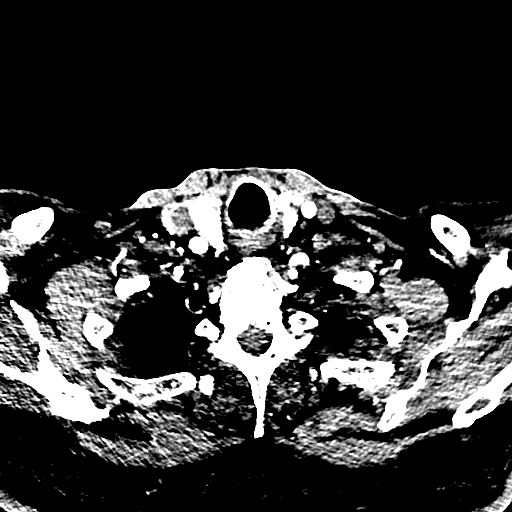
[im 183/686  bone]
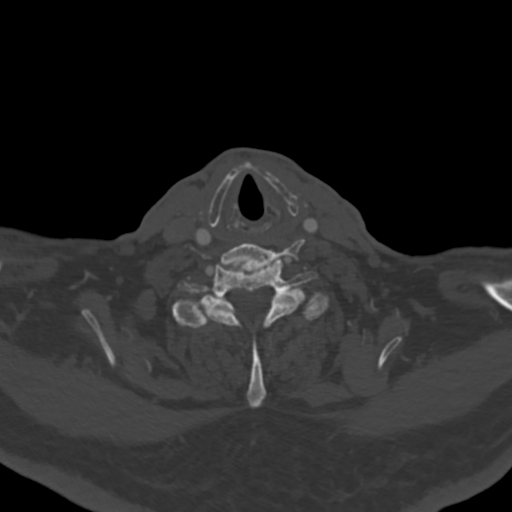
[im 229/686  brain]
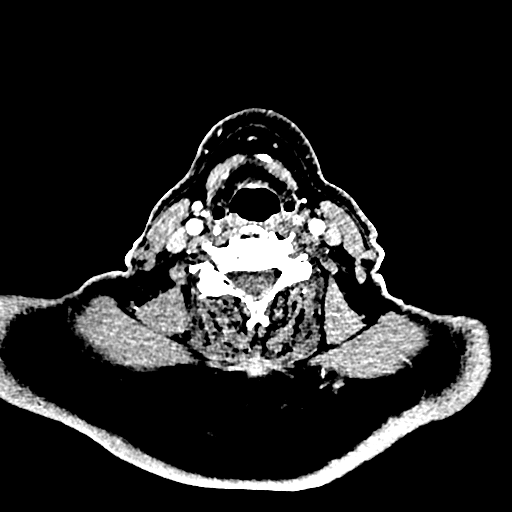
[im 275/686  bone]
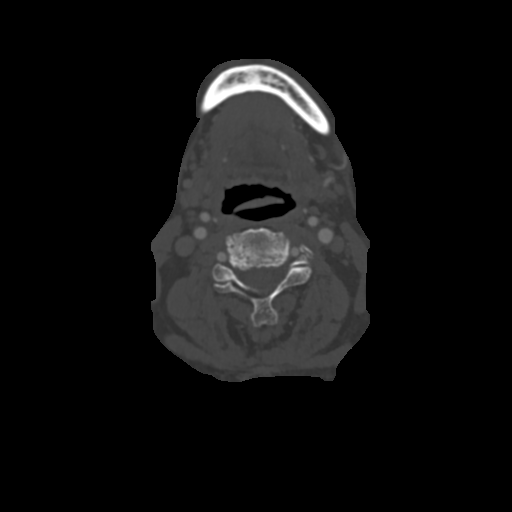
[im 320/686  brain]
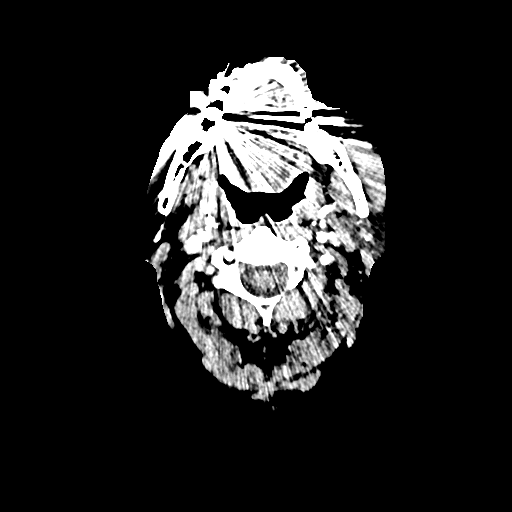
[im 366/686  bone]
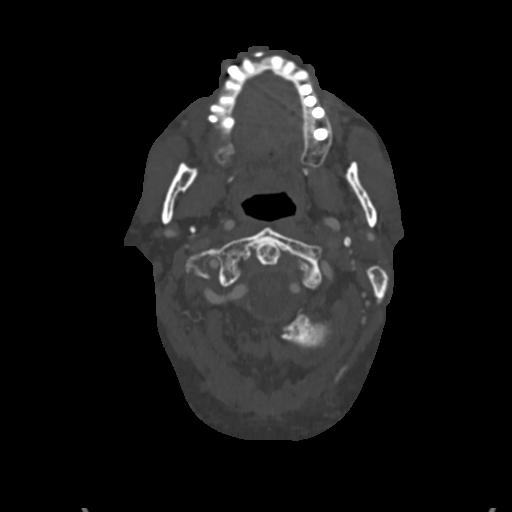
[im 412/686  brain]
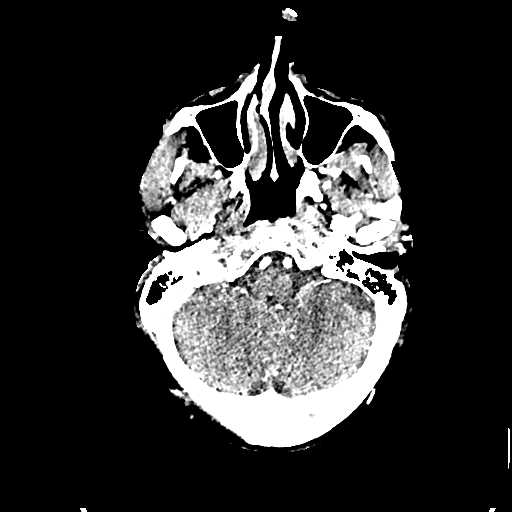
[im 457/686  bone]
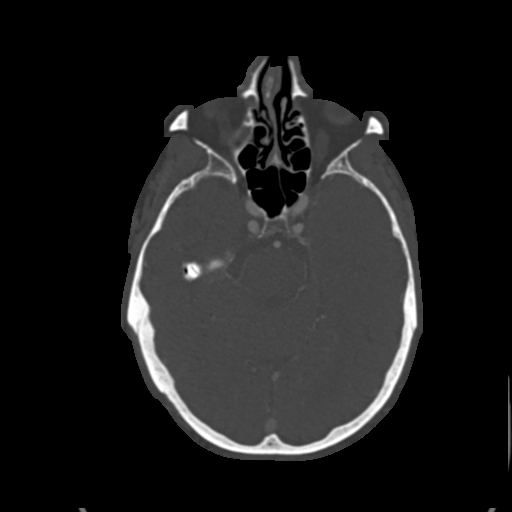
[im 503/686  brain]
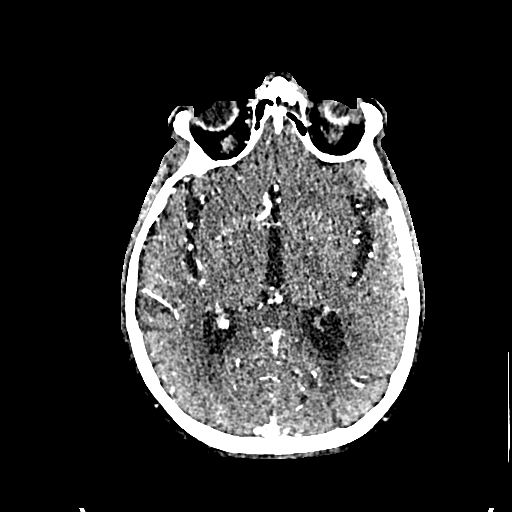
[im 549/686  bone]
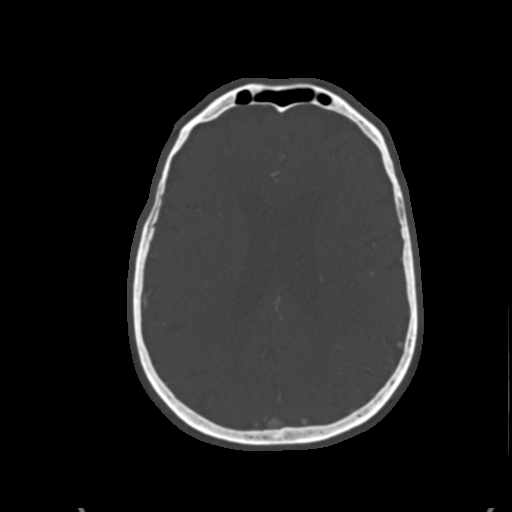
[im 594/686  brain]
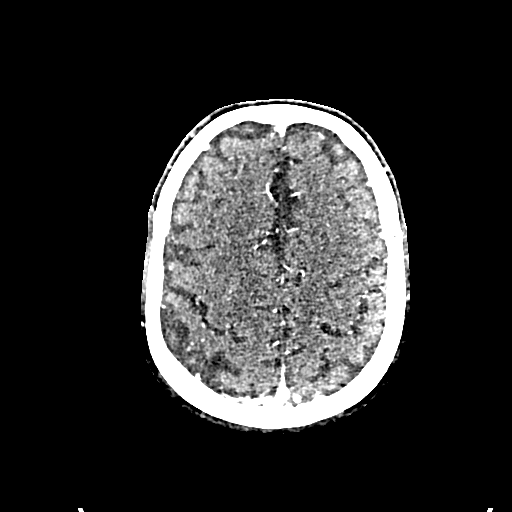
[im 640/686  bone]
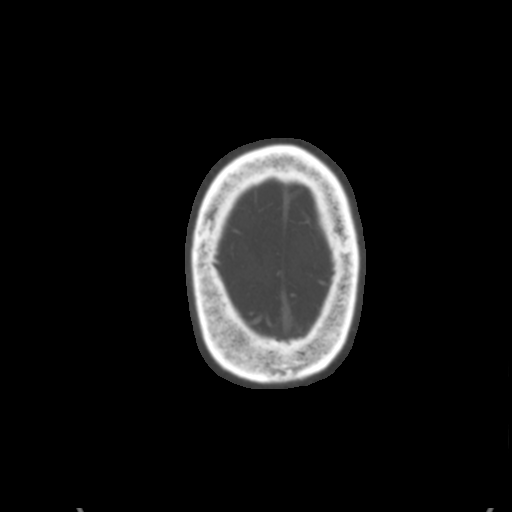

[14 of 47 positions shown; findings below may reference images not displayed]

RADIATION DOSE REDUCTION: This exam was performed according to the
departmental dose-optimization program which includes automated
exposure control, adjustment of the mA and/or kV according to
patient size and/or use of iterative reconstruction technique.

CONTRAST:  75mL OMNIPAQUE IOHEXOL 350 MG/ML SOLN
FINDINGS: CT HEAD FINDINGS

Brain: Generalized age-related cerebral atrophy with chronic
microvascular ischemic disease. No acute intracranial hemorrhage. No
acute large vessel territory infarct. No mass lesion or midline
shift. No hydrocephalus or extra-axial fluid collection.

Vascular: No hyperdense vessel. Scattered vascular calcifications
noted within the carotid siphons.

Skull: Scalp soft tissues and calvarium within normal limits.

Sinuses: Paranasal sinuses and mastoid air cells are largely clear.

Orbits: Unremarkable.

Review of the MIP images confirms the above findings

CTA NECK FINDINGS

Aortic arch: Visualized aortic arch normal caliber with normal 3
vessel morphology. Mild atheromatous change about the arch itself.
No stenosis about the origin of the great vessels.

Right carotid system: Right common and internal carotid arteries
widely patent without stenosis or dissection.

Left carotid system: Left common and internal carotid arteries
widely patent without stenosis or dissection. Mild for age
atheromatous change about the left carotid bulb without stenosis.

Vertebral arteries: Both vertebral arteries arise from the
subclavian arteries. No proximal subclavian artery stenosis. Both
vertebral arteries widely patent without stenosis, dissection or
occlusion.

Skeleton: No discrete or worrisome osseous lesions. Moderate
multilevel cervical spondylosis without high-grade spinal stenosis.

Other neck: No other acute soft tissue abnormality within the neck.
Few small thyroid nodules noted, largest of which measures 11 mm on
the right. These are of doubtful significance given size and patient
age, with no follow-up imaging recommended (ref: [HOSPITAL].
[DATE]): 143-50).

Upper chest: Visualized upper chest demonstrates no acute finding.

Review of the MIP images confirms the above findings

CTA HEAD FINDINGS

Anterior circulation: Petrous segments patent bilaterally. Mild for
age atheromatous change within the carotid siphons without stenosis.
A1 segments widely patent. Normal anterior communicating artery
complex. Anterior cerebral arteries widely patent without stenosis.
Normal in stenosis or occlusion. Normal MCA bifurcations. Distal MCA
branches well perfused and symmetric. Distal small vessel
atheromatous irregularity noted.

Posterior circulation: Both vertebral arteries patent to the
vertebrobasilar junction without significant stenosis. Both PICA
grossly patent at their origins. Basilar widely patent to its distal
aspect without stenosis. Superior cerebellar and posterior cerebral
arteries widely patent bilaterally.

Venous sinuses: Patent allowing for timing the contrast bolus.

Anatomic variants: None significant.  No aneurysm.

Review of the MIP images confirms the above findings
IMPRESSION: CT HEAD IMPRESSION:

1. No acute intracranial abnormality.
2. Mild age-related cerebral atrophy with chronic small vessel
ischemic disease.

CTA HEAD AND NECK IMPRESSION:

1. Negative CTA of the head and neck. No large vessel occlusion or
other acute vascular abnormality.
2. Mild for age atheromatous change about the carotid bifurcations
and carotid siphons without hemodynamically significant stenosis.

## 2021-08-04 MED ORDER — IOHEXOL 350 MG/ML SOLN
75.0000 mL | Freq: Once | INTRAVENOUS | Status: AC | PRN
  Administered 2021-08-04: 75 mL via INTRAVENOUS

## 2021-08-04 MED ORDER — POLYETHYLENE GLYCOL 3350 17 G PO PACK: 17.0000 g | PACK | Freq: Every day | ORAL | Status: DC | PRN

## 2021-08-04 MED ORDER — ONDANSETRON HCL 4 MG/2ML IJ SOLN: 4.0000 mg | Freq: Four times a day (QID) | INTRAMUSCULAR | Status: DC | PRN

## 2021-08-04 MED ORDER — ENOXAPARIN SODIUM 40 MG/0.4ML IJ SOSY
40.0000 mg | PREFILLED_SYRINGE | INTRAMUSCULAR | Status: DC
  Administered 2021-08-04: 40 mg via SUBCUTANEOUS
  Filled 2021-08-04: qty 0.4

## 2021-08-04 MED ORDER — ONDANSETRON HCL 4 MG PO TABS: 4.0000 mg | ORAL_TABLET | Freq: Four times a day (QID) | ORAL | Status: DC | PRN

## 2021-08-04 MED ORDER — ACETAMINOPHEN 650 MG RE SUPP: 650.0000 mg | Freq: Four times a day (QID) | RECTAL | Status: DC | PRN

## 2021-08-04 MED ORDER — ASPIRIN 81 MG PO CHEW
81.0000 mg | CHEWABLE_TABLET | Freq: Every day | ORAL | Status: DC
  Administered 2021-08-05: 81 mg via ORAL
  Filled 2021-08-04: qty 1

## 2021-08-04 MED ORDER — STROKE: EARLY STAGES OF RECOVERY BOOK
Freq: Once | Status: AC
  Administered 2021-08-04: 1
  Filled 2021-08-04 (×2): qty 1

## 2021-08-04 MED ORDER — ACETAMINOPHEN 325 MG PO TABS: 650.0000 mg | ORAL_TABLET | Freq: Four times a day (QID) | ORAL | Status: DC | PRN

## 2021-08-04 NOTE — ED Notes (Signed)
Patient transported to MRI 

## 2021-08-04 NOTE — H&P (Signed)
History and Physical    Patient: Jimmy Williams MRN: [id number] DOA: 08/04/2021  Date of Service: the patient was seen and examined on 08/05/2021  Patient coming from: Home via EMS  Chief Complaint:  Chief Complaint  Patient presents with   Fatigue    HPI:   81 year old male with past medical history of hypertension, rheumatoid arthritis who presented to [facility] emergency department via EMS due to sudden onset of slurred speech.  Of note, patient initially presented to [facility] emergency department on 1/28 status post an episode of dizziness, confusion slurred speech and fall.  Patient was found to have a scalp laceration which was repaired and after a negative work-up including MRI brain patient was discharged home.  Patient neurologically was at baseline up until approximately 4:30 PM today when he was speaking on the phone and noticed that he was not speaking normally.  He went to find his wife who noted that he was initially exhibiting word salad.  Patient did not exhibit any associated visual changes, loss of balance headache or focal weakness.   After several minutes, words seemed more appropriate but were still slurred. At this point the wife contacted EMS who promptly came to evaluate the patient and brought the patient to [facility] emergency room for evaluation.  In route, patient's speech changes continued to improve and by the time the patient arrived at [facility] emergency department patient was neurologically back to baseline.  ER provider discussed case with Dr. Leonel Ramsay with neurology who recommended overnight hospitalization for full neurologic work-up including repeat MRI of the brain.  The hospitalist group was then called to assess the patient for admission in the hospital.  Review of Systems: Review of Systems  Neurological:  Positive for speech change.  All other systems reviewed and are negative.   Past Medical History:   Diagnosis Date   Essential hypertension 08/04/2021   Hx of colonic polyps    Rheumatoid arthritis(714.0)     Past Surgical History:  Procedure Laterality Date   ROTATOR CUFF REPAIR     right    Social History:  reports that he has quit smoking. He has quit using smokeless tobacco. No history on file for alcohol use and drug use.  Allergies  Allergen Reactions   Celery (Apium Graveolens Var. Dulce) Skin Test     "Finger and tongue swell"     Family History  Problem Relation Age of Onset   Heart disease Neg Hx     Prior to Admission medications   Medication Sig Start Date End Date Taking? Authorizing Provider  ascorbic acid (VITAMIN C) 1000 MG tablet Take 10,000 mg by mouth daily.      [provider]  aspirin 81 MG chewable tablet Chew 81 mg by mouth daily.      [provider]  B-D TB SYRINGE 1CC/27GX1/2" 27G X 1/2" 1 ML MISC  08/20/18   [provider]  cetirizine (ZYRTEC HIVES RELIEF) 10 MG tablet Take 10 mg by mouth daily.      [provider]  Cholecalciferol (VITAMIN D-1000 MAX ST) 25 MCG (1000 UT) tablet Take by mouth.    [provider]  Coenzyme Q10 50 MG CAPS Take by mouth daily.      [provider]  fish oil-omega-3 fatty acids 1000 MG capsule Take 1,000 mg by mouth daily.     [provider]  folic acid (FOLVITE) 1 MG tablet Take 1 mg  by mouth daily. Methotrexate day     [provider]  losartan (COZAAR) 100 MG tablet Take by mouth.    [provider]  methotrexate (RHEUMATREX) 2.5 MG tablet once a week. Take 8 tablets once a week. Caution:Chemotherapy. Protect from light.    [provider]  Multiple Vitamin (MULTIVITAMIN) capsule Take 1 capsule by mouth daily.      [provider]  Resveratrol 100 MG CAPS Take by mouth.    [provider]  sulfaSALAzine (AZULFIDINE) 500 MG tablet Take 500 mg by mouth daily.      [provider]  vitamin E (NATURAL  VITAMIN E) 400 UNIT capsule Take 400 Units by mouth daily.      [provider]    Physical Exam:  Vitals:   08/04/21 2300 08/04/21 2330 08/05/21 0030 08/05/21 0100  BP: (!) 156/85 (!) 137/95 (!) 147/90 140/87  Pulse: 99 99 94 95  Resp: (!) 26 14 17 17   Temp:      TempSrc:      SpO2: 98% 94% 96% 93%  Weight:      Height:        Constitutional: Awake alert and oriented x3, no associated distress.   Skin: no rashes, no lesions, good skin turgor noted. Eyes: Pupils are equally reactive to light.  No evidence of scleral icterus or conjunctival pallor.  ENMT: Moist mucous membranes noted.  Posterior pharynx clear of any exudate or lesions.   Neck: normal, supple, no masses, no thyromegaly.  No evidence of jugular venous distension.   Respiratory: clear to auscultation bilaterally, no wheezing, no crackles. Normal respiratory effort. No accessory muscle use.  Cardiovascular: Regular rate and rhythm, no murmurs / rubs / gallops. No extremity edema. 2+ pedal pulses. No carotid bruits.  Chest:   Nontender without crepitus or deformity.   Back:   Nontender without crepitus or deformity. Abdomen: Abdomen is soft and nontender.  No evidence of intra-abdominal masses.  Positive bowel sounds noted in all quadrants.   Musculoskeletal: No joint deformity upper and lower extremities. Good ROM, no contractures. Normal muscle tone.  Neurologic: CN 2-12 grossly intact. Sensation intact.  Patient moving all 4 extremities spontaneously.  Patient is following all commands.  Patient is responsive to verbal stimuli.   Psychiatric: Patient exhibits normal mood with appropriate affect.  Patient seems to possess insight as to their current situation.    Data Reviewed:  I have personally reviewed and interpreted labs, imaging.  Significant findings are : white blood cell 6.9, urinalysis reviewed and unremarkable.  COVID-19 PCR testing pending.  EKG: Personally reviewed.  Rhythm is normal sinus  rhythm with heart rate of 99 bpm.  No dynamic ST segment changes appreciated.  Assessment and Plan: * TIA (transient ischemic attack)- (present on admission) Patient presenting with 2 episodes of slurred speech, most recently occurring today. Patient is now neurologically at baseline suggestive of a recurrent TIA Per neurology recommendations patient is being hospitalized for further neurologic work-up Performing serial neurologic checks Monitoring patient on telemetry Initiating antiplatelet therapy including 650 mg of aspirin now followed by 81 mg of aspirin daily per neurology recommendations. Daily statin therapy will be initiated per neurology recommendations Further imaging to include: Noncontrast MRI of the brain, CT angiogram of the head and neck Obtaining hemoglobin A1c and lipid panel  Echocardiogram in the morning PT, OT, SLP evaluation Permissive hypertension with as needed antihypertensives only to be given if blood pressure greater than 220/115.  If MRI  reveals no evidence of stroke then parameters will be reduced to 180/110. Neurology following in consultation.   Essential hypertension- (present on admission) Permissive hypertension for now until MRI brain has resulted As needed intravenous hypertensives for markedly elevated blood pressure.  Rheumatoid arthritis ([place])- (present on admission) Weekly methotrexate with daily sulfasalazine Outpatient follow-up       Code Status:  DNR  code status decision has been confirmed with: patient Family Communication: deferred   Consults: Dr. Cheral Marker with Neurology  Severity of Illness:  The appropriate patient status for this patient is OBSERVATION. Observation status is judged to be reasonable and necessary in order to provide the required intensity of service to ensure the patient's safety. The patient's presenting symptoms, physical exam findings, and initial radiographic and laboratory data in the context of their  medical condition is felt to place them at decreased risk for further clinical deterioration. Furthermore, it is anticipated that the patient will be medically stable for discharge from the hospital within 2 midnights of admission.   Author:  Vernelle Emerald MD  08/05/2021 1:24 AM

## 2021-08-04 NOTE — ED Triage Notes (Signed)
Pt arrived to ED via EMS from home w/ c/o feeling off. Pt's wife reported slurred speech lasting 4 minutes. Pt had same episode a weeks ago. No slurred speech or stroke s/s w/ EMS. VSS

## 2021-08-04 NOTE — ED Provider Notes (Signed)
[facility] EMERGENCY DEPARTMENT Provider Note   CSN: [id number] Arrival date & time: 08/04/21  1751     History  Chief Complaint  Patient presents with   Fatigue    Jimmy Williams is a 81 y.o. male.  81 yo man with PMH of HTN and RA presents today after brief episode of dysarthria. 2 weeks ago patient fell, hit his head, and had episode of slurred speech. He had CT head and MR brain that did not show any acute abnormality. Today he was on the phone around 4:30 or so and noticed that he was speaking strangely. He got off the phone, went to the living room where his wife was, and alerted her to change in speech. His wife reports that he was speaking gibberish and eventually his words became more intelligible, but still slurred. She called 911immediately. Patient reports that his speech change resolved within 30 minutes or so and he currently feels like he is at his baseline, his wife agrees. Medications include mtx for RA, folic acid, sulfasalazine, losartan. He reports no HLD or cardiac history.       Home Medications Prior to Admission medications   Medication Sig Start Date End Date Taking? Authorizing Provider  ascorbic acid (VITAMIN C) 1000 MG tablet Take 10,000 mg by mouth daily.      [provider]  aspirin 81 MG chewable tablet Chew 81 mg by mouth daily.      [provider]  B-D TB SYRINGE 1CC/27GX1/2" 27G X 1/2" 1 ML MISC  08/20/18   [provider]  cetirizine (ZYRTEC HIVES RELIEF) 10 MG tablet Take 10 mg by mouth daily.      [provider]  Cholecalciferol (VITAMIN D-1000 MAX ST) 25 MCG (1000 UT) tablet Take by mouth.    [provider]  Coenzyme Q10 50 MG CAPS Take by mouth daily.      [provider]  fish oil-omega-3 fatty acids 1000 MG capsule Take 1,000 mg by mouth daily.     [provider]  folic acid (FOLVITE) 1 MG tablet Take 1 mg by mouth daily. Methotrexate day     [provider]  losartan (COZAAR) 100 MG tablet Take by mouth.    [provider]  methotrexate (RHEUMATREX) 2.5 MG tablet once a week. Take 8 tablets once a week. Caution:Chemotherapy. Protect from light.    [provider]  Multiple Vitamin (MULTIVITAMIN) capsule Take 1 capsule by mouth daily.      [provider]  Resveratrol 100 MG CAPS Take by mouth.    [provider]  sulfaSALAzine (AZULFIDINE) 500 MG tablet Take 500 mg by mouth daily.      [provider]  vitamin E (NATURAL VITAMIN E) 400 UNIT capsule Take 400 Units by mouth daily.      [provider]      Allergies    Patient has no known allergies.    Review of Systems   Review of Systems  Constitutional:  Negative for activity change, appetite change, chills, diaphoresis and fever.  HENT:  Negative for congestion, drooling, rhinorrhea, sinus pressure, sinus pain, sore throat, tinnitus and trouble swallowing.   Eyes:  Negative for photophobia and visual disturbance.  Respiratory:  Negative for cough, choking, shortness of breath and wheezing.   Cardiovascular:  Negative for chest pain, palpitations and leg swelling.  Gastrointestinal:  Negative for abdominal pain, constipation, nausea and vomiting.  Genitourinary:  Negative for dysuria.  Musculoskeletal:  Negative for arthralgias, back pain, gait problem and myalgias.  Neurological:  Positive for speech difficulty. Negative for dizziness, tremors, seizures, syncope, facial asymmetry, weakness, light-headedness, numbness and headaches.  Psychiatric/Behavioral:  Negative for agitation, confusion and hallucinations. The patient is not nervous/anxious.   All other systems reviewed and are negative.  Physical Exam Updated Vital Signs BP (!) 152/91    Pulse (!) 101    Temp 98.5 F (36.9 C) (Oral)    Resp (!) 27    Ht 5\' 7"  (1.702 m)    Wt 83.5 kg    SpO2 98%    BMI 28.82 kg/m  Physical Exam Vitals and nursing note reviewed.   Constitutional:      General: He is not in acute distress.    Appearance: Normal appearance. He is normal weight. He is not ill-appearing, toxic-appearing or diaphoretic.  HENT:     Head: Normocephalic and atraumatic.     Nose: Nose normal.     Mouth/Throat:     Mouth: Mucous membranes are moist.     Pharynx: Oropharynx is clear. No oropharyngeal exudate or posterior oropharyngeal erythema.  Eyes:     Extraocular Movements: Extraocular movements intact.     Conjunctiva/sclera: Conjunctivae normal.  Neck:     Vascular: No carotid bruit.  Cardiovascular:     Rate and Rhythm: Normal rate and regular rhythm.     Pulses: Normal pulses.     Heart sounds: Normal heart sounds.  Pulmonary:     Effort: Pulmonary effort is normal.     Breath sounds: Normal breath sounds.  Abdominal:     General: Abdomen is flat. Bowel sounds are normal.     Palpations: Abdomen is soft.  Musculoskeletal:     Cervical back: Normal range of motion and neck supple. No rigidity.     Right lower leg: No edema.     Left lower leg: No edema.  Lymphadenopathy:     Cervical: No cervical adenopathy.  Skin:    General: Skin is warm and dry.     Capillary Refill: Capillary refill takes less than 2 seconds.  Neurological:     General: No focal deficit present.     Mental Status: He is alert and oriented to person, place, and time. Mental status is at baseline.     Comments: Cranial Nerves II: PERRL.  III,IV, VI: EOMI without ptosis or diplopia.  V: Facial sensation is symmetric to touch VII: Facial movement is symmetric.  VIII: Hearing is intact to voice X: Palate elevates symmetrically XI: Shoulder shrug is symmetric. XII: Tongue is midline without atrophy or fasciculations.  Motor: Tone is normal. Bulk is normal. 5/5 strength was present in all four extremities.  Sensory: Sensation is symmetric to light touch in the arms and legs. Deep Tendon Reflexes: 2+ and symmetric in the biceps and patellae.   Cerebellar: FNF and HKS are intact bilaterally   Psychiatric:        Mood and Affect: Mood normal.        Behavior: Behavior normal.    ED Results / Procedures / Treatments   Labs (all labs ordered are listed, but only abnormal results are displayed) Labs Reviewed  CBC - Abnormal; Notable for the following components:      Result Value   MCH 34.2 (*)    All other components within normal limits  I-STAT CHEM 8, ED - Abnormal; Notable for the following components:   Calcium, Ion 1.10 (*)  All other components within normal limits  RESP PANEL BY RT-PCR (FLU A&B, COVID) ARPGX2  PROTIME-INR  URINALYSIS, ROUTINE W REFLEX MICROSCOPIC  COMPREHENSIVE METABOLIC PANEL  RAPID URINE DRUG SCREEN, HOSP PERFORMED  LIPID PANEL  HEMOGLOBIN A1C    EKG None  Radiology No results found.  Procedures Procedures    Medications Ordered in ED Medications - No data to display  ED Course/ Medical Decision Making/ A&P Clinical Course as of 08/04/21 2020  Thu Aug 04, 2021  1918 Discussed patient with Dr. Cyd Silence who will admit him to the hospitalist service.  [CM]    Clinical Course User Index [CM] Gladys Damme, MD                           Medical Decision Making 81 yo man presents with brief, resolved episode of aphasia. Given history, demographics, and physical exam, there is a high suspicion for TIA vs Stroke. Will consult neurology.  Amount and/or Complexity of Data Reviewed Independent Historian: spouse    Details: see HPI External Data Reviewed: notes. Labs: ordered. Decision-making details documented in ED Course. Radiology: ordered. Decision-making details documented in ED Course. ECG/medicine tests: ordered. Decision-making details documented in ED Course. Discussion of management or test interpretation with external provider(s): Discussed case with Dr. Leonel Ramsay, neurology, who recommend full TIA vs stroke workup with admission to medicine. Will obtain CT head as  well as angiography of head and neck.  Risk Prescription drug management. Decision regarding hospitalization.         Final Clinical Impression(s) / ED Diagnoses Final diagnoses:  None    Rx / DC Orders ED Discharge Orders     None         Gladys Damme, MD 08/04/21 2020    Lucrezia Starch, MD 08/04/21 2302

## 2021-08-05 ENCOUNTER — Observation Stay (HOSPITAL_COMMUNITY): Payer: Medicare HMO

## 2021-08-05 ENCOUNTER — Other Ambulatory Visit (HOSPITAL_COMMUNITY): Payer: Self-pay

## 2021-08-05 ENCOUNTER — Observation Stay (HOSPITAL_BASED_OUTPATIENT_CLINIC_OR_DEPARTMENT_OTHER): Payer: Medicare HMO

## 2021-08-05 ENCOUNTER — Other Ambulatory Visit (HOSPITAL_COMMUNITY): Payer: Medicare HMO

## 2021-08-05 ENCOUNTER — Other Ambulatory Visit: Payer: Self-pay | Admitting: Student

## 2021-08-05 DIAGNOSIS — G319 Degenerative disease of nervous system, unspecified: Secondary | ICD-10-CM | POA: Diagnosis not present

## 2021-08-05 DIAGNOSIS — G459 Transient cerebral ischemic attack, unspecified: Secondary | ICD-10-CM

## 2021-08-05 DIAGNOSIS — I10 Essential (primary) hypertension: Secondary | ICD-10-CM | POA: Diagnosis not present

## 2021-08-05 LAB — ECHOCARDIOGRAM COMPLETE BUBBLE STUDY
AR max vel: 2.91 cm2
AV Peak grad: 5.6 mmHg
Ao pk vel: 1.18 m/s
Area-P 1/2: 3.48 cm2
Calc EF: 58.4 %
MV M vel: 3.45 m/s
MV Peak grad: 47.6 mmHg
S' Lateral: 2.8 cm
Single Plane A2C EF: 59.9 %
Single Plane A4C EF: 56.5 %

## 2021-08-05 LAB — COMPREHENSIVE METABOLIC PANEL
ALT: 40 U/L (ref 0–44)
AST: 37 U/L (ref 15–41)
Albumin: 3.6 g/dL (ref 3.5–5.0)
Alkaline Phosphatase: 58 U/L (ref 38–126)
Anion gap: 10 (ref 5–15)
BUN: 12 mg/dL (ref 8–23)
CO2: 24 mmol/L (ref 22–32)
Calcium: 8.7 mg/dL — ABNORMAL LOW (ref 8.9–10.3)
Chloride: 103 mmol/L (ref 98–111)
Creatinine, Ser: 0.88 mg/dL (ref 0.61–1.24)
GFR, Estimated: >60 mL/min (ref >60)
Glucose, Bld: 103 mg/dL — ABNORMAL HIGH (ref 70–99)
Potassium: 3.3 mmol/L — ABNORMAL LOW (ref 3.5–5.1)
Sodium: 137 mmol/L (ref 135–145)
Total Bilirubin: 0.7 mg/dL (ref 0.3–1.2)
Total Protein: 6.8 g/dL (ref 6.5–8.1)

## 2021-08-05 LAB — CBC WITH DIFFERENTIAL/PLATELET
Abs Immature Granulocytes: 0.01 10*3/uL (ref 0.00–0.07)
Basophils Absolute: 0.1 10*3/uL (ref 0.0–0.1)
Basophils Relative: 1 %
Eosinophils Absolute: 0.2 10*3/uL (ref 0.0–0.5)
Eosinophils Relative: 3 %
HCT: 42.9 % (ref 39.0–52.0)
Hemoglobin: 14.5 g/dL (ref 13.0–17.0)
Immature Granulocytes: 0 %
Lymphocytes Relative: 24 %
Lymphs Abs: 1.6 10*3/uL (ref 0.7–4.0)
MCH: 33.6 pg (ref 26.0–34.0)
MCHC: 33.8 g/dL (ref 30.0–36.0)
MCV: 99.3 fL (ref 80.0–100.0)
Monocytes Absolute: 0.7 10*3/uL (ref 0.1–1.0)
Monocytes Relative: 10 %
Neutro Abs: 4 10*3/uL (ref 1.7–7.7)
Neutrophils Relative %: 62 %
Platelets: 175 10*3/uL (ref 150–400)
RBC: 4.32 MIL/uL (ref 4.22–5.81)
RDW: 13.7 % (ref 11.5–15.5)
WBC: 6.5 10*3/uL (ref 4.0–10.5)
nRBC: 0 % (ref 0.0–0.2)

## 2021-08-05 LAB — MAGNESIUM: Magnesium: 2.1 mg/dL (ref 1.7–2.4)

## 2021-08-05 MED ORDER — HYDRALAZINE HCL 20 MG/ML IJ SOLN: 10.0000 mg | Freq: Four times a day (QID) | INTRAMUSCULAR | Status: DC | PRN

## 2021-08-05 MED ORDER — ATORVASTATIN CALCIUM 40 MG PO TABS
40.0000 mg | ORAL_TABLET | Freq: Every day | ORAL | 0 refills | Status: DC
  Filled 2021-08-05: qty 30, 30d supply, fill #0

## 2021-08-05 MED ORDER — ATORVASTATIN CALCIUM 40 MG PO TABS: 80.0000 mg | ORAL_TABLET | Freq: Every day | ORAL | Status: DC

## 2021-08-05 MED ORDER — ASPIRIN 325 MG PO TABS
650.0000 mg | ORAL_TABLET | Freq: Once | ORAL | Status: AC
  Administered 2021-08-05: 650 mg via ORAL
  Filled 2021-08-05: qty 2

## 2021-08-05 MED ORDER — CLOPIDOGREL BISULFATE 75 MG PO TABS
75.0000 mg | ORAL_TABLET | Freq: Every day | ORAL | 0 refills | Status: DC
  Filled 2021-08-05: qty 21, 21d supply, fill #0

## 2021-08-05 MED ORDER — ASPIRIN 81 MG PO CHEW: 81.0000 mg | CHEWABLE_TABLET | Freq: Every day | ORAL | Status: AC

## 2021-08-05 MED ORDER — SULFASALAZINE 500 MG PO TABS
500.0000 mg | ORAL_TABLET | Freq: Every day | ORAL | Status: DC
  Administered 2021-08-05: 500 mg via ORAL
  Filled 2021-08-05 (×2): qty 1

## 2021-08-05 MED ORDER — ATORVASTATIN CALCIUM 40 MG PO TABS
40.0000 mg | ORAL_TABLET | Freq: Every day | ORAL | Status: DC
  Filled 2021-08-05: qty 1

## 2021-08-05 MED ORDER — CLOPIDOGREL BISULFATE 75 MG PO TABS
75.0000 mg | ORAL_TABLET | Freq: Every day | ORAL | Status: DC
  Administered 2021-08-05: 75 mg via ORAL
  Filled 2021-08-05: qty 1

## 2021-08-05 NOTE — Progress Notes (Addendum)
STROKE TEAM PROGRESS NOTE   INTERVAL HISTORY Patient is able to recall all events leading up to.  He was talking on the phone and then went to talk to wife, she said his speech was garbled.  Changes in speech resolved after about 15 minutes.  He states that he knew what he wanted to say, but couldn't get it out.  Recommend heart monitor 30 day, aspirin, Plavix, atorvastatin. Ordered EEG and ECHO He had a similar episode approximately 2 weeks ago, no significant findings in work-up.  This episode lasted about 15 minutes.   Vitals:   08/05/21 0200 08/05/21 0300 08/05/21 0400 08/05/21 0500  BP: 139/87 130/87 123/80 (!) 141/88  Pulse: (!) 106 100 97 93  Resp: (!) 0 15 14 19   Temp:      TempSrc:      SpO2: 92% 96% 93% 95%  Weight:      Height:       CBC:  Recent Labs  Lab 08/04/21 1947 08/04/21 1957 08/05/21 0234  WBC 6.9  --  6.5  NEUTROABS  --   --  4.0  HGB 15.1 16.0 14.5  HCT 44.1 47.0 42.9  MCV 100.0  --  99.3  PLT 185  --  703   Basic Metabolic Panel:  Recent Labs  Lab 08/04/21 1947 08/04/21 1957 08/05/21 0234  NA 137 140 137  K 3.9 4.0 3.3*  CL 102 103 103  CO2 26  --  24  GLUCOSE 91 88 103*  BUN 15 16 12   CREATININE 0.89 0.80 0.88  CALCIUM 8.9  --  8.7*  MG  --   --  2.1   Lipid Panel:  Recent Labs  Lab 08/04/21 1947  CHOL 183  TRIG 86  HDL 63  CHOLHDL 2.9  VLDL 17  LDLCALC 103*   HgbA1c:  Recent Labs  Lab 08/04/21 1947  HGBA1C 4.5*   Urine Drug Screen:  Recent Labs  Lab 08/04/21 1946  LABOPIA NONE DETECTED  COCAINSCRNUR NONE DETECTED  LABBENZ NONE DETECTED  AMPHETMU NONE DETECTED  THCU NONE DETECTED  LABBARB NONE DETECTED    Alcohol Level No results for input(s): ETH in the last 168 hours.  IMAGING past 24 hours CT ANGIO HEAD NECK W WO CM  Result Date: 08/04/2021 CLINICAL DATA:  Initial evaluation for acute TIA. EXAM: CT ANGIOGRAPHY HEAD AND NECK TECHNIQUE: Multidetector CT imaging of the head and neck was performed using the standard  protocol during bolus administration of intravenous contrast. Multiplanar CT image reconstructions and MIPs were obtained to evaluate the vascular anatomy. Carotid stenosis measurements (when applicable) are obtained utilizing NASCET criteria, using the distal internal carotid diameter as the denominator. RADIATION DOSE REDUCTION: This exam was performed according to the departmental dose-optimization program which includes automated exposure control, adjustment of the mA and/or kV according to patient size and/or use of iterative reconstruction technique. CONTRAST:  58mL OMNIPAQUE IOHEXOL 350 MG/ML SOLN COMPARISON:  Prior study from 07/23/2021 FINDINGS: CT HEAD FINDINGS Brain: Generalized age-related cerebral atrophy with chronic microvascular ischemic disease. No acute intracranial hemorrhage. No acute large vessel territory infarct. No mass lesion or midline shift. No hydrocephalus or extra-axial fluid collection. Vascular: No hyperdense vessel. Scattered vascular calcifications noted within the carotid siphons. Skull: Scalp soft tissues and calvarium within normal limits. Sinuses: Paranasal sinuses and mastoid air cells are largely clear. Orbits: Unremarkable. Review of the MIP images confirms the above findings CTA NECK FINDINGS Aortic arch: Visualized aortic arch normal caliber with normal 3  vessel morphology. Mild atheromatous change about the arch itself. No stenosis about the origin of the great vessels. Right carotid system: Right common and internal carotid arteries widely patent without stenosis or dissection. Left carotid system: Left common and internal carotid arteries widely patent without stenosis or dissection. Mild for age atheromatous change about the left carotid bulb without stenosis. Vertebral arteries: Both vertebral arteries arise from the subclavian arteries. No proximal subclavian artery stenosis. Both vertebral arteries widely patent without stenosis, dissection or occlusion. Skeleton: No  discrete or worrisome osseous lesions. Moderate multilevel cervical spondylosis without high-grade spinal stenosis. Other neck: No other acute soft tissue abnormality within the neck. Few small thyroid nodules noted, largest of which measures 11 mm on the right. These are of doubtful significance given size and patient age, with no follow-up imaging recommended (ref: J Am Coll Radiol. 2015 Feb;12(2): 143-50). Upper chest: Visualized upper chest demonstrates no acute finding. Review of the MIP images confirms the above findings CTA HEAD FINDINGS Anterior circulation: Petrous segments patent bilaterally. Mild for age atheromatous change within the carotid siphons without stenosis. A1 segments widely patent. Normal anterior communicating artery complex. Anterior cerebral arteries widely patent without stenosis. Normal in stenosis or occlusion. Normal MCA bifurcations. Distal MCA branches well perfused and symmetric. Distal small vessel atheromatous irregularity noted. Posterior circulation: Both vertebral arteries patent to the vertebrobasilar junction without significant stenosis. Both PICA grossly patent at their origins. Basilar widely patent to its distal aspect without stenosis. Superior cerebellar and posterior cerebral arteries widely patent bilaterally. Venous sinuses: Patent allowing for timing the contrast bolus. Anatomic variants: None significant.  No aneurysm. Review of the MIP images confirms the above findings IMPRESSION: CT HEAD IMPRESSION: 1. No acute intracranial abnormality. 2. Mild age-related cerebral atrophy with chronic small vessel ischemic disease. CTA HEAD AND NECK IMPRESSION: 1. Negative CTA of the head and neck. No large vessel occlusion or other acute vascular abnormality. 2. Mild for age atheromatous change about the carotid bifurcations and carotid siphons without hemodynamically significant stenosis. Electronically Signed   By: Jeannine Boga M.D.   On: 08/04/2021 22:12   MR  BRAIN WO CONTRAST  Result Date: 08/05/2021 CLINICAL DATA:  Initial evaluation for neuro deficit, stroke suspected. EXAM: MRI HEAD WITHOUT CONTRAST TECHNIQUE: Multiplanar, multiecho pulse sequences of the brain and surrounding structures were obtained without intravenous contrast. COMPARISON:  Prior CTA from 08/04/2021 and brain MRI from 07/23/2021. FINDINGS: Brain: Mild age-related cerebral atrophy with chronic small vessel ischemic disease, stable. No abnormal foci of restricted diffusion to suggest acute or subacute ischemia. Gray-white matter differentiation maintained. No areas of chronic cortical infarction. No acute intracranial hemorrhage. Single punctate chronic microhemorrhage noted at the right splenium, of doubtful significance in isolation. No mass lesion, midline shift or mass effect no hydrocephalus or extra-axial fluid collection. Pituitary gland suprasellar region normal. Midline structures intact. Vascular: Major intracranial vascular flow voids are well maintained. Skull and upper cervical spine: Craniocervical junction within normal limits. Bone marrow signal intensity mildly heterogeneous but overall within normal limits. No scalp soft tissue abnormality. Sinuses/Orbits: Globes normal soft tissues demonstrate no acute finding. Mild scattered mucosal thickening noted within the ethmoidal air cells and maxillary sinuses. No mastoid effusion. Inner ear structures grossly normal. Other: Chronic fatty atrophy of the visualized right parotid gland noted. IMPRESSION: 1. No acute intracranial abnormality. 2. Mild age-related cerebral atrophy with chronic small vessel ischemic disease, stable. Electronically Signed   By: Jeannine Boga M.D.   On: 08/05/2021 01:22    PHYSICAL  EXAM  Physical Exam  Constitutional: Appears well-developed and well-nourished.  Cardiovascular: Normal rate and regular rhythm.  Respiratory: Effort normal, non-labored breathing  Neuro: Mental Status: Patient is  awake, alert, oriented to person, place, month, year, and situation. Patient is able to give a clear and coherent history. No signs of aphasia or neglect Cranial Nerves: II: Visual Fields are full. Pupils are equal, round, and reactive to light.   III,IV, VI: EOMI without ptosis or diploplia.  V: Facial sensation is symmetric to temperature VII: Facial movement is symmetric resting and smiling VIII: Hearing is intact to voice X: Palate elevates symmetrically XI: Shoulder shrug is symmetric. XII: Tongue protrudes midline without atrophy or fasciculations.  Motor: Tone is normal. Bulk is normal. 5/5 strength was present in all four extremities.  Sensory: Sensation is symmetric to light touch and temperature in the arms and legs. No extinction to DSS present.  Deep Tendon Reflexes: 2+ and symmetric in the biceps and patellae.  Plantars: Toes are downgoing bilaterally.  Cerebellar: FNF and HKS are intact bilaterally   ASSESSMENT/PLAN Mr. Jimmy Williams is a 81 y.o. male with history of hypertension and rheumatoid arthritis presenting with a transient episode of dysphagia and dysarthria lasting approximately 4 minutes.  He was given 650 mg of aspirin in the ED.  Added Plavix and continue home ASA.  TIA Code Stroke - CT head No acute abnormality. ASPECTS 10.    CTA head & neck- No large vessel occlusion or other acute vascular abnormality. Mild for age atheromatous change about the carotid bifurcations and carotid siphons without hemodynamically significant stenosis.  MRI  No acute intracranial abnormality. Generalized volume loss and findings of chronic small vessel disease. 2D Echo EF 60-65%, no shunt detected EEG-pending LDL 103 HgbA1c 4.5 VTE prophylaxis - SCDs    Diet   Diet heart healthy/carb modified Room service appropriate? Yes; Fluid consistency: Thin   Was not on any antiplatelet prior to admission, now on aspirin 81 mg daily and clopidogrel 75 mg daily for 3 weeks and  then ASA 81 alone. Therapy recommendations: Pending Disposition: Pending  Hypertension Home meds: Losartan 100mg  Stable Permissive hypertension (OK if < 220/120) but gradually normalize in 5-7 days Long-term BP goal normotensive  Hyperlipidemia LDL 103, goal < 70 Add atorvastatin 40 mg Continue statin at discharge  Other Stroke Risk Factors Advanced Age >/= 59  Former smoker, recommend continue smoking cessation  Hx stroke/TIA  Other Active Problems Rheumatoid arthritis  Weekly methotrexate with daily sulfasalazine   Hospital day # 0  Patient seen and examined by NP/APP with MD. MD to update note as needed.   Janine Ores, DNP, FNP-BC [facility] Pager: [phone]  ATTENDING NOTE: I reviewed above note and agree with the assessment and plan. Pt was seen and examined.   81 year old male with history of hypertension, rheumatoid arthritis, admitted for speech difficulty for 30 minutes.  He had a similar episode 2 weeks ago and CT/MRI negative for acute stroke at that time.  On this admission, CT head and neck unremarkable, MRI no acute infarct.  EF 60 to 65%, EEG pending, LDL 103, A1c 4.5.  UDS negative.  Creatinine 0.88.  On exam, patient awake alert, neurologically intact, no focal deficit.  Patient symptoms consistent with TIA episodes, exact etiology unclear.  Recommend 30-day cardiac event monitor as outpatient to rule out A-fib.  Continue aspirin 81 and Plavix 75 DAPT for 3 weeks and then aspirin alone.  Continue Lipitor 40 on discharge.  Follow-up in neuro clinic.  For detailed assessment and plan, please refer to above as I have made changes wherever appropriate.   Neurology will sign off. Please call with questions. Pt will follow up with stroke clinic NP at [facility] in about 4 weeks. Thanks for the consult.   Rosalin Hawking, MD PhD Stroke Neurology 08/05/2021 7:45 PM   To contact Stroke Continuity provider, please refer to [email or web address] After hours,  contact General Neurology

## 2021-08-05 NOTE — Assessment & Plan Note (Signed)
·   Permissive hypertension for now until MRI brain has resulted  As needed intravenous hypertensives for markedly elevated blood pressure.

## 2021-08-05 NOTE — Progress Notes (Signed)
Event monitor ordered for further evaluation of TIA at the request of Dr. Waldron Labs. Patient has never been seen by our office so we will also arrange New Patient Visit with Dr. Acie Fredrickson (DOD today) to review results.  Darreld Mclean, PA-C 08/05/2021 1:41 PM

## 2021-08-05 NOTE — Discharge Instructions (Signed)
Follow with Primary MD Seward Carol, MD in 7 days   Get CBC, CMP,checked  by Primary MD next visit.    Activity: As tolerated with Full fall precautions use walker/cane & assistance as needed   Disposition Home    Diet: Heart Healthy  , with feeding assistance and aspiration precautions.   On your next visit with your primary care physician please Get Medicines reviewed and adjusted.   Please request your Prim.MD to go over all Hospital Tests and Procedure/Radiological results at the follow up, please get all Hospital records sent to your Prim MD by signing hospital release before you go home.   If you experience worsening of your admission symptoms, develop shortness of breath, life threatening emergency, suicidal or homicidal thoughts you must seek medical attention immediately by calling 911 or calling your MD immediately  if symptoms less severe.  You Must read complete instructions/literature along with all the possible adverse reactions/side effects for all the Medicines you take and that have been prescribed to you. Take any new Medicines after you have completely understood and accpet all the possible adverse reactions/side effects.   Do not drive, operating heavy machinery, perform activities at heights, swimming or participation in water activities or provide baby sitting services if your were admitted for syncope or siezures until you have seen by Primary MD or a Neurologist and advised to do so again.  Do not drive when taking Pain medications.    Do not take more than prescribed Pain, Sleep and Anxiety Medications  Special Instructions: If you have smoked or chewed Tobacco  in the last 2 yrs please stop smoking, stop any regular Alcohol  and or any Recreational drug use.  Wear Seat belts while driving.   Please note  You were cared for by a hospitalist during your hospital stay. If you have any questions about your discharge medications or the care you received  while you were in the hospital after you are discharged, you can call the unit and asked to speak with the hospitalist on call if the hospitalist that took care of you is not available. Once you are discharged, your primary care physician will handle any further medical issues. Please note that NO REFILLS for any discharge medications will be authorized once you are discharged, as it is imperative that you return to your primary care physician (or establish a relationship with a primary care physician if you do not have one) for your aftercare needs so that they can reassess your need for medications and monitor your lab values.

## 2021-08-05 NOTE — Care Management Obs Status (Signed)
[place] NOTIFICATION   Patient Details  Name: Jimmy Williams MRN: [id number] Date of Birth: 1941/06/04   Medicare Observation Status Notification Given:  Ernesta Amble, RN 08/05/2021, 4:10 PM

## 2021-08-05 NOTE — Procedures (Addendum)
[facility]  Routine EEG Report  Patient Name:   Jimmy Williams, Jimmy Williams Date of Birth:   [birth year] Identification Number:   MRN - [id number]  Date of Study:   08/05/2021 10:40:50 Duration : 22 minutes  Indication: Spells, Eval for Seizures  Technical Summary: A routine 20 channel electroencephalogram using the international 10-20 system of electrode placement was performed.  Background: 9-10 Hz, Posterior dominant rhythm that attenuated with eye Opening  States: Awake   Activation Procedures: Hyperventilation and Photic Stimulation not performed  Classification: Normal    Clinical Correlation: This is a normal study. The absence of interictal epileptiform abnormalities does not exclude the diagnosis of a seizure disorder.      Dr Annabelle Harman   [facility] [phone]  Case [id number]

## 2021-08-05 NOTE — Consult Note (Addendum)
NEURO HOSPITALIST CONSULT NOTE   Requesting physician: Dr. Cyd Silence  Reason for Consult: Transient spell of aphasia.  History obtained from:   Patient and Chart     HPI:                                                                                                                                          Jimmy Williams is an 81 y.o. male with a PMHx of HTN and RA who presents to the ED after a 4 minute episode of dysphasia and dysarthria at home. He had a similar episode about 2 weeks ago but has no other history of TIA-like symptoms. Symptoms today occurred at about 4:30 PM towards the end of a phone call, at which time he noticed that he was speaking strangely. After getting off the phone, he went to the living room where his wife was, and alerted her to the change in his speech. His wife's recollection is that his speech was nonsensical and like gibberish, with some real words but also with some unintelligible word-like utterances. Gradually, his words became more intelligible, but were still dysarthric. His wife called 61. The speech change resolved within about 30 minutes. On arrival to the ED, the patient and his wife both stated that he was back to his baseline.   Past Medical History:  Diagnosis Date   Essential hypertension 08/04/2021   Hx of colonic polyps    Rheumatoid arthritis(714.0)     Past Surgical History:  Procedure Laterality Date   ROTATOR CUFF REPAIR     right    Family History  Problem Relation Age of Onset   Heart disease Neg Hx             Social History:  reports that he has quit smoking. He has quit using smokeless tobacco. No history on file for alcohol use and drug use.  Allergies  Allergen Reactions   Celery (Apium Graveolens Var. Dulce) Skin Test     "Finger and tongue swell"     MEDICATIONS:                                                                                                                      No current  facility-administered medications on file prior  to encounter.   Current Outpatient Medications on File Prior to Encounter  Medication Sig Dispense Refill   acetaminophen (TYLENOL) 500 MG tablet Take 500 mg by mouth every 6 (six) hours as needed for mild pain or moderate pain.     ascorbic acid (VITAMIN C) 1000 MG tablet Take 10,000 mg by mouth daily.       cetirizine (ZYRTEC) 10 MG tablet Take 10 mg by mouth daily.       Cholecalciferol 25 MCG (1000 UT) tablet Take 1,000 Units by mouth daily.     Coenzyme Q10 50 MG CAPS Take 50 mg by mouth daily.     folic acid (FOLVITE) 1 MG tablet Take 1 mg by mouth daily. Methotrexate day      ibuprofen (ADVIL) 600 MG tablet Take 600 mg by mouth every 6 (six) hours as needed for fever or headache.     methotrexate (RHEUMATREX) 2.5 MG tablet once a week. Take 8 tablets once a week. Caution:Chemotherapy. Protect from light.     Multiple Vitamin (MULTIVITAMIN) capsule Take 1 capsule by mouth daily.       sulfaSALAzine (AZULFIDINE) 500 MG tablet Take 500 mg by mouth daily.       aspirin 81 MG chewable tablet Chew 81 mg by mouth daily.   (Patient not taking: Reported on 08/04/2021)     B-D TB SYRINGE 1CC/27GX1/2" 27G X 1/2" 1 ML MISC      fish oil-omega-3 fatty acids 1000 MG capsule Take 1,000 mg by mouth daily.  (Patient not taking: Reported on 08/04/2021)     losartan (COZAAR) 100 MG tablet Take by mouth. (Patient not taking: Reported on 08/04/2021)     Resveratrol 100 MG CAPS Take by mouth. (Patient not taking: Reported on 08/04/2021)     vitamin E 180 MG (400 UNITS) capsule Take 400 Units by mouth daily.   (Patient not taking: Reported on 08/04/2021)       ROS:                                                                                                                                       As per HPI.    Blood pressure (!) 147/90, pulse 94, temperature 98.5 F (36.9 C), temperature source Oral, resp. rate 17, height 5\' 7"  (1.702 m), weight 83.5 kg, SpO2 96  %.   General Examination:  Physical Exam  HEENT-  [place]/AT   Lungs- Respirations unlabored Extremities- No edema  Neurological Examination Mental Status: Awake and alert. Oriented x 5. Speech is fluent with intact naming, comprehension and repetition. Affect euthymic. Pleasant and cooperative.  Cranial Nerves: II: Temporal visual fields intact with no extinction to DSS. PERRL.   III,IV, VI: No ptosis. EOMI. No nystagmus.   V: Temp sensation equal bilaterally VII: Smile symmetric VIII: Hearing intact to conversation IX,X: No hypophonia or hoarseness XI: Symmetric shoulder shrug XII: Midline tongue extension Motor: Right : Upper extremity   5/5    Left:     Upper extremity   5/5  Lower extremity   5/5     Lower extremity   5/5 No pronator drift Sensory: Temp and light touch intact throughout, bilaterally Deep Tendon Reflexes: 2+ and symmetric throughout Plantars: Right: downgoing   Left: downgoing Cerebellar: No ataxia with FNF bilaterally Gait: Deferred   Lab Results: Basic Metabolic Panel: Recent Labs  Lab 08/04/21 1947 08/04/21 1957  NA 137 140  K 3.9 4.0  CL 102 103  CO2 26  --   GLUCOSE 91 88  BUN 15 16  CREATININE 0.89 0.80  CALCIUM 8.9  --     CBC: Recent Labs  Lab 08/04/21 1947 08/04/21 1957  WBC 6.9  --   HGB 15.1 16.0  HCT 44.1 47.0  MCV 100.0  --   PLT 185  --     Cardiac Enzymes: No results for input(s): CKTOTAL, CKMB, CKMBINDEX, TROPONINI in the last 168 hours.  Lipid Panel: Recent Labs  Lab 08/04/21 1947  CHOL 183  TRIG 86  HDL 63  CHOLHDL 2.9  VLDL 17  LDLCALC 103*    Imaging: CT ANGIO HEAD NECK W WO CM  Result Date: 08/04/2021 CLINICAL DATA:  Initial evaluation for acute TIA. EXAM: CT ANGIOGRAPHY HEAD AND NECK TECHNIQUE: Multidetector CT imaging of the head and neck was performed using the standard protocol during bolus  administration of intravenous contrast. Multiplanar CT image reconstructions and MIPs were obtained to evaluate the vascular anatomy. Carotid stenosis measurements (when applicable) are obtained utilizing NASCET criteria, using the distal internal carotid diameter as the denominator. RADIATION DOSE REDUCTION: This exam was performed according to the departmental dose-optimization program which includes automated exposure control, adjustment of the mA and/or kV according to patient size and/or use of iterative reconstruction technique. CONTRAST:  95mL OMNIPAQUE IOHEXOL 350 MG/ML SOLN COMPARISON:  Prior study from 07/23/2021 FINDINGS: CT HEAD FINDINGS Brain: Generalized age-related cerebral atrophy with chronic microvascular ischemic disease. No acute intracranial hemorrhage. No acute large vessel territory infarct. No mass lesion or midline shift. No hydrocephalus or extra-axial fluid collection. Vascular: No hyperdense vessel. Scattered vascular calcifications noted within the carotid siphons. Skull: Scalp soft tissues and calvarium within normal limits. Sinuses: Paranasal sinuses and mastoid air cells are largely clear. Orbits: Unremarkable. Review of the MIP images confirms the above findings CTA NECK FINDINGS Aortic arch: Visualized aortic arch normal caliber with normal 3 vessel morphology. Mild atheromatous change about the arch itself. No stenosis about the origin of the great vessels. Right carotid system: Right common and internal carotid arteries widely patent without stenosis or dissection. Left carotid system: Left common and internal carotid arteries widely patent without stenosis or dissection. Mild for age atheromatous change about the left carotid bulb without stenosis. Vertebral arteries: Both vertebral arteries arise from the subclavian arteries. No proximal subclavian artery stenosis. Both vertebral arteries widely patent without stenosis, dissection or occlusion.  Skeleton: No discrete or worrisome  osseous lesions. Moderate multilevel cervical spondylosis without high-grade spinal stenosis. Other neck: No other acute soft tissue abnormality within the neck. Few small thyroid nodules noted, largest of which measures 11 mm on the right. These are of doubtful significance given size and patient age, with no follow-up imaging recommended (ref: J Am Coll Radiol. 2015 Feb;12(2): 143-50). Upper chest: Visualized upper chest demonstrates no acute finding. Review of the MIP images confirms the above findings CTA HEAD FINDINGS Anterior circulation: Petrous segments patent bilaterally. Mild for age atheromatous change within the carotid siphons without stenosis. A1 segments widely patent. Normal anterior communicating artery complex. Anterior cerebral arteries widely patent without stenosis. Normal in stenosis or occlusion. Normal MCA bifurcations. Distal MCA branches well perfused and symmetric. Distal small vessel atheromatous irregularity noted. Posterior circulation: Both vertebral arteries patent to the vertebrobasilar junction without significant stenosis. Both PICA grossly patent at their origins. Basilar widely patent to its distal aspect without stenosis. Superior cerebellar and posterior cerebral arteries widely patent bilaterally. Venous sinuses: Patent allowing for timing the contrast bolus. Anatomic variants: None significant.  No aneurysm. Review of the MIP images confirms the above findings IMPRESSION: CT HEAD IMPRESSION: 1. No acute intracranial abnormality. 2. Mild age-related cerebral atrophy with chronic small vessel ischemic disease. CTA HEAD AND NECK IMPRESSION: 1. Negative CTA of the head and neck. No large vessel occlusion or other acute vascular abnormality. 2. Mild for age atheromatous change about the carotid bifurcations and carotid siphons without hemodynamically significant stenosis. Electronically Signed   By: Jeannine Boga M.D.   On: 08/04/2021 22:12     Assessment: 81 year old  male presenting after a transient episode of expressive aphasia lasting for about 30 minutes. He had a similar episode about 2 weeks ago. No other history of TIA-like symptoms.  1. Neurological exam is nonfocal.  2. CT head: No acute abnormality.  3. Stroke risk factors: HTN  Recommendations: 1. HgbA1c, fasting lipid panel 2. MRI of the brain without contrast 3. CTA of head and neck  4. Echocardiogram 5. PT consult, OT consult, Speech consult 6. 650 mg crushed ASA x 1 now. Continue home ASA dosing of 81 mg po qd at 10 AM.   7. Adding Plavix to his stroke prophylaxis regimen as he has failed ASA monotherapy 8. Telemetry monitoring 9. Frequent neuro checks 10. NPO until passes stroke swallow screen 11. Risk factor modification 12. Permissive HTN x 24 hours   Addendum: - CTA of head and neck: Negative CTA of the head and neck. No large vessel occlusion or other acute vascular abnormality. Mild for age atheromatous change about the carotid bifurcations and carotid siphons without hemodynamically significant stenosis.  - MRI brain: No acute intracranial abnormality. Generalized volume loss and findings of chronic small vessel disease.  Electronically signed: Dr. Kerney Elbe 08/05/2021, 1:11 AM

## 2021-08-05 NOTE — Progress Notes (Signed)
EEG complete - results pending 

## 2021-08-05 NOTE — Progress Notes (Signed)
OT Cancellation Note  Patient Details Name: Jimmy Williams MRN: [id number] DOB: 1940-11-22   Cancelled Treatment:    Reason Eval/Treat Not Completed: OT screened, witnessed walking with PT in hallway, speaking clearly, quick questioning revealed no weakness or fine motor deficits. PT in agreement that OT can screen at this time. no needs identified, will sign off  Jaci Carrel 08/05/2021, 9:32 AM  Jesse Sans OTR/L Acute Rehabilitation Services Pager: [phone] Office: [phone]

## 2021-08-05 NOTE — Evaluation (Signed)
Physical Therapy Evaluation Patient Details Name: Jimmy Williams MRN: [id number] DOB: 1940/12/17 Today's Date: 08/05/2021  History of Present Illness  Jimmy Williams is an 81 y.o. male admitted under observation 08/04/21 after a 4 minute episode of dysphasia and dysarthria at home. He had a similar episode about 2 weeks ago but has no other history of TIA-like symptoms. On arrival to the ED, the patient and his wife both stated that he was back to his baseline. MRI negative for acute abnormality. PMHx includes HTN and RA.  Clinical Impression  Patient presents with the above problem with the impairments below. Patient required min guard assist with mobility tasks today for safety. Able to perform DGI tasks with very mild deficits. No overt LOB. Per patient and his wife, he is at his baseline. Patient and spouse educated on returning to ED if any future stroke like symptoms occur. Gave written "Be Fast" acronym and meaning to wife. No further PT services needed at this time. PT to sign off. Please reconsult if need change.     Recommendations for follow up therapy are one component of a multi-disciplinary discharge planning process, led by the attending physician.  Recommendations may be updated based on patient status, additional functional criteria and insurance authorization.  Follow Up Recommendations No PT follow up    Assistance Recommended at Discharge PRN  Patient can return home with the following  Assist for transportation    Equipment Recommendations None recommended by PT  Recommendations for Other Services       Functional Status Assessment Patient has had a recent decline in their functional status and demonstrates the ability to make significant improvements in function in a reasonable and predictable amount of time.     Precautions / Restrictions Precautions Precautions: None Restrictions Weight Bearing Restrictions: No      Mobility  Bed Mobility Overal bed  mobility: Independent             General bed mobility comments: able to perform bed mobility in a timely manner w/ no supervision or assist needed    Transfers Overall transfer level: Needs assistance Equipment used: None Transfers: Sit to/from Stand Sit to Stand: Min guard           General transfer comment: needed increased time to perform stand to sit transfer and min guard assist for safety secondary to mild LOB. Pt able to recover independently    Ambulation/Gait Ambulation/Gait assistance: Min guard Gait Distance (Feet): 150 Feet Assistive device: None Gait Pattern/deviations: Step-through pattern, WFL(Within Functional Limits) Gait velocity: WFL Gait velocity interpretation: >2.62 ft/sec, indicative of community ambulatory   General Gait Details: walked with a normal pace with normal step length and stride. He did require min guard for steadying after stepping over object in hallway.  Stairs            Wheelchair Mobility    Modified Rankin (Stroke Patients Only)       Balance Overall balance assessment: Modified Independent (increased time to react with arms, ankle, and hip strategies to regain balance with advanced balance assessments in DGI)                               Standardized Balance Assessment Standardized Balance Assessment : Dynamic Gait Index   Dynamic Gait Index Level Surface: Normal Change in Gait Speed: Normal Gait with Horizontal Head Turns: Normal Gait with Vertical Head Turns: Normal Gait and Pivot  Turn: Mild Impairment Step Over Obstacle: Mild Impairment Step Around Obstacles: Normal       Pertinent Vitals/Pain Pain Assessment Pain Assessment: No/denies pain    Home Living Family/patient expects to be discharged to:: Private residence Living Arrangements: Spouse/significant other Available Help at Discharge: Family Type of Home:  (townhome) Home Access: Stairs to enter Entrance Stairs-Rails:  Psychiatric nurse of Steps: 3   Home Layout: Two level;Full bath on main level;Able to live on main level with bedroom/bathroom (rarely go upstairs in home) Home Equipment: Shower seat - built in      Prior Function Prior Level of Function : Independent/Modified Independent                     Hand Dominance        Extremity/Trunk Assessment   Upper Extremity Assessment Upper Extremity Assessment: Overall WFL for tasks assessed    Lower Extremity Assessment Lower Extremity Assessment: Overall WFL for tasks assessed    Cervical / Trunk Assessment Cervical / Trunk Assessment: Normal  Communication   Communication: No difficulties  Cognition Arousal/Alertness: Awake/alert Behavior During Therapy: WFL for tasks assessed/performed Overall Cognitive Status: Within Functional Limits for tasks assessed                                 General Comments: sweet and pleasant        General Comments      Exercises     Assessment/Plan    PT Assessment Patient does not need any further PT services  PT Problem List         PT Treatment Interventions      PT Goals (Current goals can be found in the Care Plan section)  Acute Rehab PT Goals Patient Stated Goal: to go home PT Goal Formulation: With patient/family Time For Goal Achievement: 08/05/21 Potential to Achieve Goals: Good    Frequency       Co-evaluation               AM-PAC PT "6 Clicks" Mobility  Outcome Measure Help needed turning from your back to your side while in a flat bed without using bedrails?: None Help needed moving from lying on your back to sitting on the side of a flat bed without using bedrails?: None Help needed moving to and from a bed to a chair (including a wheelchair)?: None Help needed standing up from a chair using your arms (e.g., wheelchair or bedside chair)?: A Little Help needed to walk in hospital room?: A Little Help needed climbing  3-5 steps with a railing? : A Little 6 Click Score: 21    End of Session Equipment Utilized During Treatment: Gait belt Activity Tolerance: Patient tolerated treatment well Patient left: in bed;with call bell/phone within reach;with family/visitor present (on stretcher in ED) Nurse Communication: Mobility status PT Visit Diagnosis: Unsteadiness on feet (R26.81);History of falling (Z91.81)    Time: 9794-8016 PT Time Calculation (min) (ACUTE ONLY): 19 min   Charges:   PT Evaluation $PT Eval Low Complexity: 1 Low          Jonne Ply, SPT  [place] 08/05/2021, 11:50 AM

## 2021-08-05 NOTE — Discharge Summary (Addendum)
Physician Discharge Summary  Jimmy Williams YBO:[id number] DOB: 1941-02-26 DOA: 08/04/2021  PCP: Seward Carol, MD  Admit date: 08/04/2021 Discharge date: 08/05/2021  Admitted From:(Home Disposition:  Home  Recommendations for Outpatient Follow-up:  Follow up with PCP in 1-2 weeks Please obtain BMP/CBC in one week Monitor blood pressure, and start meds if it is elevated. Please follow on the final results of EEG  Home Health:NO   Discharge Condition:Stable CODE STATUS:FULL Diet recommendation: Heart Healthy   Brief/Interim Summary:  81 year old male with past medical history of hypertension, rheumatoid arthritis who presented to [facility] emergency department via EMS due to sudden onset of slurred speech.  He was admitted for further work-up.  TIA -He presented with slurred speech, MRI brain with no acute intracranial abnormalities, generalized volume loss and findings of chronic small vessel disease-.  CTA head and neck with no large vessel occlusion or other acute vascular abnormality. - 2D Echo EF 60-65%, no shunt detected -Neurology input greatly appreciated, recommendation for dual antiplatelet therapy aspirin and Plavix, then aspirin alone, he was not on any antiplatelet prior to admission. - A1c  is acceptable at 4.5.    Essential hypertension- (present on admission) -Blood pressure has been acceptable during hospital stay, he used to be on losartan, this can be started as an outpatient if blood pressure is elevated.    Rheumatoid arthritis -Continue with methotrexate and sulfasalazine   Hyperlipidemia -LDL is 103, with target goal<70, started on atorvastatin, to continue on discharge   Discharge Diagnoses:  Principal Problem:   TIA (transient ischemic attack) Active Problems:   Rheumatoid arthritis ([place])   Essential hypertension    Discharge Instructions   Allergies as of 08/05/2021       Reactions   Celery (apium Graveolens Var. Dulce) Skin  Test    "Finger and tongue swell"         Medication List     STOP taking these medications    ascorbic acid 1000 MG tablet Commonly known as: VITAMIN C   ibuprofen 600 MG tablet Commonly known as: ADVIL   losartan 100 MG tablet Commonly known as: COZAAR   Resveratrol 100 MG Caps       TAKE these medications    acetaminophen 500 MG tablet Commonly known as: TYLENOL Take 500 mg by mouth every 6 (six) hours as needed for mild pain or moderate pain.   aspirin 81 MG chewable tablet Chew 1 tablet (81 mg total) by mouth daily.   atorvastatin 40 MG tablet Commonly known as: LIPITOR Take 1 tablet (40 mg total) by mouth daily.   B-D TB SYRINGE 1CC/27GX1/2" 27G X 1/2" 1 ML Misc Generic drug: TUBERCULIN SYR 1CC/27GX1/2"   cetirizine 10 MG tablet Commonly known as: ZYRTEC Take 10 mg by mouth daily.   Cholecalciferol 25 MCG (1000 UT) tablet Take 1,000 Units by mouth daily.   clopidogrel 75 MG tablet Commonly known as: PLAVIX Take 1 tablet (75 mg total) by mouth daily.   Coenzyme Q10 50 MG Caps Take 50 mg by mouth daily.   fish oil-omega-3 fatty acids 1000 MG capsule Take 1,000 mg by mouth daily.   folic acid 1 MG tablet Commonly known as: FOLVITE Take 1 mg by mouth daily. Methotrexate day   methotrexate 2.5 MG tablet Commonly known as: RHEUMATREX once a week. Take 8 tablets once a week. Caution:Chemotherapy. Protect from light.   multivitamin capsule Take 1 capsule by mouth daily.   sulfaSALAzine 500 MG tablet Commonly known as:  AZULFIDINE Take 500 mg by mouth daily.   vitamin E 180 MG (400 UNITS) capsule Take 400 Units by mouth daily.        Follow-up Information     Nahser, Wonda Cheng, MD Follow up.   Specialty: Cardiology Why: Appointment with Cardiology scheduled for 09/27/2021 at 8:40am to discuss heart monitor results. Please arrive 15 minutes early for check-in. If this date/time does not work for you, please call our office to  arrange. Contact information: [address] [place] [address] [phone]                Allergies  Allergen Reactions   Celery (Apium Graveolens Var. Dulce) Skin Test     "Finger and tongue swell"     Consultations: Neurology   Procedures/Studies: CT ANGIO HEAD NECK W WO CM  Result Date: 08/04/2021 CLINICAL DATA:  Initial evaluation for acute TIA. EXAM: CT ANGIOGRAPHY HEAD AND NECK TECHNIQUE: Multidetector CT imaging of the head and neck was performed using the standard protocol during bolus administration of intravenous contrast. Multiplanar CT image reconstructions and MIPs were obtained to evaluate the vascular anatomy. Carotid stenosis measurements (when applicable) are obtained utilizing NASCET criteria, using the distal internal carotid diameter as the denominator. RADIATION DOSE REDUCTION: This exam was performed according to the departmental dose-optimization program which includes automated exposure control, adjustment of the mA and/or kV according to patient size and/or use of iterative reconstruction technique. CONTRAST:  62mL OMNIPAQUE IOHEXOL 350 MG/ML SOLN COMPARISON:  Prior study from 07/23/2021 FINDINGS: CT HEAD FINDINGS Brain: Generalized age-related cerebral atrophy with chronic microvascular ischemic disease. No acute intracranial hemorrhage. No acute large vessel territory infarct. No mass lesion or midline shift. No hydrocephalus or extra-axial fluid collection. Vascular: No hyperdense vessel. Scattered vascular calcifications noted within the carotid siphons. Skull: Scalp soft tissues and calvarium within normal limits. Sinuses: Paranasal sinuses and mastoid air cells are largely clear. Orbits: Unremarkable. Review of the MIP images confirms the above findings CTA NECK FINDINGS Aortic arch: Visualized aortic arch normal caliber with normal 3 vessel morphology. Mild atheromatous change about the arch itself. No stenosis about the origin of the great  vessels. Right carotid system: Right common and internal carotid arteries widely patent without stenosis or dissection. Left carotid system: Left common and internal carotid arteries widely patent without stenosis or dissection. Mild for age atheromatous change about the left carotid bulb without stenosis. Vertebral arteries: Both vertebral arteries arise from the subclavian arteries. No proximal subclavian artery stenosis. Both vertebral arteries widely patent without stenosis, dissection or occlusion. Skeleton: No discrete or worrisome osseous lesions. Moderate multilevel cervical spondylosis without high-grade spinal stenosis. Other neck: No other acute soft tissue abnormality within the neck. Few small thyroid nodules noted, largest of which measures 11 mm on the right. These are of doubtful significance given size and patient age, with no follow-up imaging recommended (ref: J Am Coll Radiol. 2015 Feb;12(2): 143-50). Upper chest: Visualized upper chest demonstrates no acute finding. Review of the MIP images confirms the above findings CTA HEAD FINDINGS Anterior circulation: Petrous segments patent bilaterally. Mild for age atheromatous change within the carotid siphons without stenosis. A1 segments widely patent. Normal anterior communicating artery complex. Anterior cerebral arteries widely patent without stenosis. Normal in stenosis or occlusion. Normal MCA bifurcations. Distal MCA branches well perfused and symmetric. Distal small vessel atheromatous irregularity noted. Posterior circulation: Both vertebral arteries patent to the vertebrobasilar junction without significant stenosis. Both PICA grossly patent at their origins. Basilar  widely patent to its distal aspect without stenosis. Superior cerebellar and posterior cerebral arteries widely patent bilaterally. Venous sinuses: Patent allowing for timing the contrast bolus. Anatomic variants: None significant.  No aneurysm. Review of the MIP images confirms  the above findings IMPRESSION: CT HEAD IMPRESSION: 1. No acute intracranial abnormality. 2. Mild age-related cerebral atrophy with chronic small vessel ischemic disease. CTA HEAD AND NECK IMPRESSION: 1. Negative CTA of the head and neck. No large vessel occlusion or other acute vascular abnormality. 2. Mild for age atheromatous change about the carotid bifurcations and carotid siphons without hemodynamically significant stenosis. Electronically Signed   By: Jeannine Boga M.D.   On: 08/04/2021 22:12   CT Head Wo Contrast  Result Date: 07/23/2021 CLINICAL DATA:  Transient ischemic attack (TIA) EXAM: CT HEAD WITHOUT CONTRAST TECHNIQUE: Contiguous axial images were obtained from the base of the skull through the vertex without intravenous contrast. RADIATION DOSE REDUCTION: This exam was performed according to the departmental dose-optimization program which includes automated exposure control, adjustment of the mA and/or kV according to patient size and/or use of iterative reconstruction technique. COMPARISON:  None. FINDINGS: Brain: No evidence of acute infarction, hemorrhage, hydrocephalus, extra-axial collection or mass lesion/mass effect. Periventricular white matter hypodensities consistent with sequela of chronic microvascular ischemic disease. Vascular: Vascular calcifications of the carotid siphons. Skull: No acute skull fracture.  Osteopenia. Sinuses/Orbits: Mucosal thickening of the RIGHT maxillary sinus. Other: Small amount of edema the RIGHT forehead. IMPRESSION: No acute intracranial abnormality. Electronically Signed   By: Valentino Saxon M.D.   On: 07/23/2021 17:45   MR BRAIN WO CONTRAST  Result Date: 08/05/2021 CLINICAL DATA:  Initial evaluation for neuro deficit, stroke suspected. EXAM: MRI HEAD WITHOUT CONTRAST TECHNIQUE: Multiplanar, multiecho pulse sequences of the brain and surrounding structures were obtained without intravenous contrast. COMPARISON:  Prior CTA from 08/04/2021  and brain MRI from 07/23/2021. FINDINGS: Brain: Mild age-related cerebral atrophy with chronic small vessel ischemic disease, stable. No abnormal foci of restricted diffusion to suggest acute or subacute ischemia. Gray-white matter differentiation maintained. No areas of chronic cortical infarction. No acute intracranial hemorrhage. Single punctate chronic microhemorrhage noted at the right splenium, of doubtful significance in isolation. No mass lesion, midline shift or mass effect no hydrocephalus or extra-axial fluid collection. Pituitary gland suprasellar region normal. Midline structures intact. Vascular: Major intracranial vascular flow voids are well maintained. Skull and upper cervical spine: Craniocervical junction within normal limits. Bone marrow signal intensity mildly heterogeneous but overall within normal limits. No scalp soft tissue abnormality. Sinuses/Orbits: Globes normal soft tissues demonstrate no acute finding. Mild scattered mucosal thickening noted within the ethmoidal air cells and maxillary sinuses. No mastoid effusion. Inner ear structures grossly normal. Other: Chronic fatty atrophy of the visualized right parotid gland noted. IMPRESSION: 1. No acute intracranial abnormality. 2. Mild age-related cerebral atrophy with chronic small vessel ischemic disease, stable. Electronically Signed   By: Jeannine Boga M.D.   On: 08/05/2021 01:22   MR BRAIN WO CONTRAST  Result Date: 07/23/2021 CLINICAL DATA:  encephalopathy EXAM: MRI HEAD WITHOUT CONTRAST TECHNIQUE: Multiplanar, multiecho pulse sequences of the brain and surrounding structures were obtained without intravenous contrast. COMPARISON:  None. FINDINGS: Brain: No acute infarct, mass effect or extra-axial collection. No acute or chronic hemorrhage. There is multifocal hyperintense T2-weighted signal within the white matter. Generalized volume loss without a clear lobar predilection. The midline structures are normal. Vascular:  Major flow voids are preserved. Skull and upper cervical spine: Normal calvarium and skull base. Visualized upper  cervical spine and soft tissues are normal. Sinuses/Orbits:No paranasal sinus fluid levels or advanced mucosal thickening. No mastoid or middle ear effusion. Normal orbits. IMPRESSION: 1. No acute intracranial abnormality. 2. Generalized volume loss and findings of chronic small vessel disease. Electronically Signed   By: Ulyses Jarred M.D.   On: 07/23/2021 20:26   ECHOCARDIOGRAM COMPLETE BUBBLE STUDY  Result Date: 08/05/2021    ECHOCARDIOGRAM REPORT   Patient Name:   Jimmy Williams Date of Exam: 08/05/2021 Medical Rec #:  [id number]       Height:       67.0 in Accession #:    [id number]      Weight:       184.0 lb Date of Birth:  Sep 14, 1940       BSA:          1.952 m Patient Age:    29 years        BP:           130/87 mmHg Patient Gender: M               HR:           88 bpm. Exam Location:  Inpatient Procedure: 2D Echo, Cardiac Doppler, Color Doppler and Saline Contrast Bubble            Study Indications:    Stroke  History:        Patient has no prior history of Echocardiogram examinations.                 Risk Factors:Hypertension.  Sonographer:    Jyl Heinz Referring Phys: [id number] [place]  1. Left ventricular ejection fraction, by estimation, is 60 to 65%. The left ventricle has normal function. The left ventricle has no regional wall motion abnormalities. Left ventricular diastolic parameters are consistent with Grade I diastolic dysfunction (impaired relaxation).  2. Right ventricular systolic function is normal. The right ventricular size is normal. There is normal pulmonary artery systolic pressure. The estimated right ventricular systolic pressure is 07.8 mmHg.  3. The mitral valve is normal in structure. Trivial mitral valve regurgitation. No evidence of mitral stenosis.  4. The aortic valve is tricuspid. There is mild calcification of the aortic valve. Aortic  valve regurgitation is not visualized. Aortic valve sclerosis is present, with no evidence of aortic valve stenosis.  5. The inferior vena cava is normal in size with greater than 50% respiratory variability, suggesting right atrial pressure of 3 mmHg.  6. Agitated saline contrast bubble study was negative, with no evidence of any interatrial shunt. Conclusion(s)/Recommendation(s): No intracardiac source of embolism detected on this transthoracic study. Consider a transesophageal echocardiogram to exclude cardiac source of embolism if clinically indicated. FINDINGS  Left Ventricle: Left ventricular ejection fraction, by estimation, is 60 to 65%. The left ventricle has normal function. The left ventricle has no regional wall motion abnormalities. The left ventricular internal cavity size was normal in size. There is  no left ventricular hypertrophy. Left ventricular diastolic parameters are consistent with Grade I diastolic dysfunction (impaired relaxation). Right Ventricle: The right ventricular size is normal. No increase in right ventricular wall thickness. Right ventricular systolic function is normal. There is normal pulmonary artery systolic pressure. The tricuspid regurgitant velocity is 2.39 m/s, and  with an assumed right atrial pressure of 3 mmHg, the estimated right ventricular systolic pressure is 67.5 mmHg. Left Atrium: Left atrial size was normal in size. Right Atrium: Right atrial size was normal in  size. Prominent Eustachian valve. Pericardium: There is no evidence of pericardial effusion. Mitral Valve: The mitral valve is normal in structure. Trivial mitral valve regurgitation. No evidence of mitral valve stenosis. Tricuspid Valve: The tricuspid valve is normal in structure. Tricuspid valve regurgitation is trivial. No evidence of tricuspid stenosis. Aortic Valve: The aortic valve is tricuspid. There is mild calcification of the aortic valve. Aortic valve regurgitation is not visualized. Aortic valve  sclerosis is present, with no evidence of aortic valve stenosis. Aortic valve peak gradient measures 5.6 mmHg. Pulmonic Valve: The pulmonic valve was normal in structure. Pulmonic valve regurgitation is not visualized. No evidence of pulmonic stenosis. Aorta: The aortic root is normal in size and structure. Venous: The inferior vena cava is normal in size with greater than 50% respiratory variability, suggesting right atrial pressure of 3 mmHg. IAS/Shunts: No atrial level shunt detected by color flow Doppler. Agitated saline contrast was given intravenously to evaluate for intracardiac shunting. Agitated saline contrast bubble study was negative, with no evidence of any interatrial shunt.  LEFT VENTRICLE PLAX 2D LVIDd:         4.20 cm      Diastology LVIDs:         2.80 cm      LV e' medial:    5.33 cm/s LV PW:         0.80 cm      LV E/e' medial:  12.8 LV IVS:        1.10 cm      LV e' lateral:   7.40 cm/s LVOT diam:     2.10 cm      LV E/e' lateral: 9.2 LV SV:         70 LV SV Index:   36 LVOT Area:     3.46 cm  LV Volumes (MOD) LV vol d, MOD A2C: 114.0 ml LV vol d, MOD A4C: 84.0 ml LV vol s, MOD A2C: 45.7 ml LV vol s, MOD A4C: 36.5 ml LV SV MOD A2C:     68.3 ml LV SV MOD A4C:     84.0 ml LV SV MOD BP:      59.6 ml RIGHT VENTRICLE            IVC RV Basal diam:  3.30 cm    IVC diam: 1.30 cm RV Mid diam:    2.50 cm RV S prime:     9.79 cm/s TAPSE (M-mode): 2.2 cm LEFT ATRIUM             Index        RIGHT ATRIUM           Index LA diam:        3.70 cm 1.90 cm/m   RA Area:     11.20 cm LA Vol (A2C):   51.6 ml 26.44 ml/m  RA Volume:   23.70 ml  12.14 ml/m LA Vol (A4C):   31.9 ml 16.34 ml/m LA Biplane Vol: 42.1 ml 21.57 ml/m  AORTIC VALVE AV Area (Vmax): 2.91 cm AV Vmax:        118.00 cm/s AV Peak Grad:   5.6 mmHg LVOT Vmax:      99.20 cm/s LVOT Vmean:     73.500 cm/s LVOT VTI:       0.201 m  AORTA Ao Root diam: 3.60 cm Ao Asc diam:  2.90 cm MITRAL VALVE               TRICUSPID VALVE MV  Area (PHT): 3.48 cm     TR Peak grad:   22.8 mmHg MV Decel Time: 218 msec    TR Vmax:        239.00 cm/s MR Peak grad: 47.6 mmHg MR Vmax:      345.00 cm/s  SHUNTS MV E velocity: 68.10 cm/s  Systemic VTI:  0.20 m MV A velocity: 96.00 cm/s  Systemic Diam: 2.10 cm MV E/A ratio:  0.71 Mihai Croitoru MD Electronically signed by Sanda Klein MD Signature Date/Time: 08/05/2021/2:17:50 PM    Final       Subjective: Denies any slurred speech, or any focal deficits today  Discharge Exam: Vitals:   08/05/21 1130 08/05/21 1200  BP: 126/79 138/79  Pulse: 96 84  Resp: 18 19  Temp:    SpO2: 95% 94%   Vitals:   08/05/21 0800 08/05/21 0900 08/05/21 1130 08/05/21 1200  BP: (!) 131/92 (!) 150/96 126/79 138/79  Pulse: 98 (!) 101 96 84  Resp: 20 19 18 19   Temp:      TempSrc:      SpO2: 93% 94% 95% 94%  Weight:      Height:        General: Pt is alert, awake, not in acute distress Cardiovascular: RRR, S1/S2 +, no rubs, no gallops Respiratory: CTA bilaterally, no wheezing, no rhonchi Abdominal: Soft, NT, ND, bowel sounds + Extremities: no edema, no cyanosis    The results of significant diagnostics from this hospitalization (including imaging, microbiology, ancillary and laboratory) are listed below for reference.     Microbiology: Recent Results (from the past 240 hour(s))  Resp Panel by RT-PCR (Flu A&B, Covid) Nasopharyngeal Swab     Status: None   Collection Time: 08/04/21  7:21 PM   Specimen: Nasopharyngeal Swab; Nasopharyngeal(NP) swabs in vial transport medium  Result Value Ref Range Status   SARS Coronavirus 2 by RT PCR NEGATIVE NEGATIVE Final    Comment: (NOTE) SARS-CoV-2 target nucleic acids are NOT DETECTED.  The SARS-CoV-2 RNA is generally detectable in upper respiratory specimens during the acute phase of infection. The lowest concentration of SARS-CoV-2 viral copies this assay can detect is 138 copies/mL. A negative result does not preclude SARS-Cov-2 infection and should not be used as the sole  basis for treatment or other patient management decisions. A negative result may occur with  improper specimen collection/handling, submission of specimen other than nasopharyngeal swab, presence of viral mutation(s) within the areas targeted by this assay, and inadequate number of viral copies(<138 copies/mL). A negative result must be combined with clinical observations, patient history, and epidemiological information. The expected result is Negative.  Fact Sheet for Patients:  EntrepreneurPulse.com.au  Fact Sheet for Healthcare Providers:  IncredibleEmployment.be  This test is no t yet approved or cleared by the [place] FDA and  has been authorized for detection and/or diagnosis of SARS-CoV-2 by FDA under an Emergency Use Authorization (EUA). This EUA will remain  in effect (meaning this test can be used) for the duration of the COVID-19 declaration under Section 564(b)(1) of the Act, 21 U.S.C.section 360bbb-3(b)(1), unless the authorization is terminated  or revoked sooner.       Influenza A by PCR NEGATIVE NEGATIVE Final   Influenza B by PCR NEGATIVE NEGATIVE Final    Comment: (NOTE) The Xpert Xpress SARS-CoV-2/FLU/RSV plus assay is intended as an aid in the diagnosis of influenza from Nasopharyngeal swab specimens and should not be used as a sole basis for treatment. Nasal washings and aspirates are  unacceptable for Xpert Xpress SARS-CoV-2/FLU/RSV testing.  Fact Sheet for Patients: EntrepreneurPulse.com.au  Fact Sheet for Healthcare Providers: IncredibleEmployment.be  This test is not yet approved or cleared by the [place] FDA and has been authorized for detection and/or diagnosis of SARS-CoV-2 by FDA under an Emergency Use Authorization (EUA). This EUA will remain in effect (meaning this test can be used) for the duration of the COVID-19 declaration under Section 564(b)(1) of the Act,  21 U.S.C. section 360bbb-3(b)(1), unless the authorization is terminated or revoked.  Performed at [facility], [place] [address] [place], [place] [address]      Labs: BNP (last 3 results) No results for input(s): BNP in the last 8760 hours. Basic Metabolic Panel: Recent Labs  Lab 08/04/21 1947 08/04/21 1957 08/05/21 0234  NA 137 140 137  K 3.9 4.0 3.3*  CL 102 103 103  CO2 26  --  24  GLUCOSE 91 88 103*  BUN 15 16 12   CREATININE 0.89 0.80 0.88  CALCIUM 8.9  --  8.7*  MG  --   --  2.1   Liver Function Tests: Recent Labs  Lab 08/04/21 1947 08/05/21 0234  AST 41 37  ALT 46* 40  ALKPHOS 74 58  BILITOT 0.5 0.7  PROT 7.1 6.8  ALBUMIN 4.1 3.6   No results for input(s): LIPASE, AMYLASE in the last 168 hours. No results for input(s): AMMONIA in the last 168 hours. CBC: Recent Labs  Lab 08/04/21 1947 08/04/21 1957 08/05/21 0234  WBC 6.9  --  6.5  NEUTROABS  --   --  4.0  HGB 15.1 16.0 14.5  HCT 44.1 47.0 42.9  MCV 100.0  --  99.3  PLT 185  --  175   Cardiac Enzymes: No results for input(s): CKTOTAL, CKMB, CKMBINDEX, TROPONINI in the last 168 hours. BNP: Invalid input(s): POCBNP CBG: No results for input(s): GLUCAP in the last 168 hours. D-Dimer No results for input(s): DDIMER in the last 72 hours. Hgb A1c Recent Labs    08/04/21 1947  HGBA1C 4.5*   Lipid Profile Recent Labs    08/04/21 1947  CHOL 183  HDL 63  LDLCALC 103*  TRIG 86  CHOLHDL 2.9   Thyroid function studies No results for input(s): TSH, T4TOTAL, T3FREE, THYROIDAB in the last 72 hours.  Invalid input(s): FREET3 Anemia work up No results for input(s): VITAMINB12, FOLATE, FERRITIN, TIBC, IRON, RETICCTPCT in the last 72 hours. Urinalysis    Component Value Date/Time   COLORURINE YELLOW 08/04/2021 1946   APPEARANCEUR CLEAR 08/04/2021 1946   LABSPEC 1.006 08/04/2021 1946   PHURINE 7.0 08/04/2021 1946   GLUCOSEU NEGATIVE 08/04/2021 1946   HGBUR NEGATIVE 08/04/2021 1946    HGBUR negative 02/12/2009 [place] 08/04/2021 1946   BILIRUBINUR n 03/01/2011 1222   KETONESUR NEGATIVE 08/04/2021 1946   PROTEINUR NEGATIVE 08/04/2021 1946   UROBILINOGEN 0.2 03/01/2011 1222   UROBILINOGEN 0.2 02/12/2009 0925   NITRITE NEGATIVE 08/04/2021 1946   LEUKOCYTESUR NEGATIVE 08/04/2021 1946   Sepsis Labs Invalid input(s): PROCALCITONIN,  WBC,  LACTICIDVEN Microbiology Recent Results (from the past 240 hour(s))  Resp Panel by RT-PCR (Flu A&B, Covid) Nasopharyngeal Swab     Status: None   Collection Time: 08/04/21  7:21 PM   Specimen: Nasopharyngeal Swab; Nasopharyngeal(NP) swabs in vial transport medium  Result Value Ref Range Status   SARS Coronavirus 2 by RT PCR NEGATIVE NEGATIVE Final    Comment: (NOTE) SARS-CoV-2 target nucleic acids are NOT  DETECTED.  The SARS-CoV-2 RNA is generally detectable in upper respiratory specimens during the acute phase of infection. The lowest concentration of SARS-CoV-2 viral copies this assay can detect is 138 copies/mL. A negative result does not preclude SARS-Cov-2 infection and should not be used as the sole basis for treatment or other patient management decisions. A negative result may occur with  improper specimen collection/handling, submission of specimen other than nasopharyngeal swab, presence of viral mutation(s) within the areas targeted by this assay, and inadequate number of viral copies(<138 copies/mL). A negative result must be combined with clinical observations, patient history, and epidemiological information. The expected result is Negative.  Fact Sheet for Patients:  EntrepreneurPulse.com.au  Fact Sheet for Healthcare Providers:  IncredibleEmployment.be  This test is no t yet approved or cleared by the [place] FDA and  has been authorized for detection and/or diagnosis of SARS-CoV-2 by FDA under an Emergency Use Authorization (EUA). This EUA will  remain  in effect (meaning this test can be used) for the duration of the COVID-19 declaration under Section 564(b)(1) of the Act, 21 U.S.C.section 360bbb-3(b)(1), unless the authorization is terminated  or revoked sooner.       Influenza A by PCR NEGATIVE NEGATIVE Final   Influenza B by PCR NEGATIVE NEGATIVE Final    Comment: (NOTE) The Xpert Xpress SARS-CoV-2/FLU/RSV plus assay is intended as an aid in the diagnosis of influenza from Nasopharyngeal swab specimens and should not be used as a sole basis for treatment. Nasal washings and aspirates are unacceptable for Xpert Xpress SARS-CoV-2/FLU/RSV testing.  Fact Sheet for Patients: EntrepreneurPulse.com.au  Fact Sheet for Healthcare Providers: IncredibleEmployment.be  This test is not yet approved or cleared by the [place] FDA and has been authorized for detection and/or diagnosis of SARS-CoV-2 by FDA under an Emergency Use Authorization (EUA). This EUA will remain in effect (meaning this test can be used) for the duration of the COVID-19 declaration under Section 564(b)(1) of the Act, 21 U.S.C. section 360bbb-3(b)(1), unless the authorization is terminated or revoked.  Performed at [facility], [place] [address], [place], [place] [address]      Time coordinating discharge: Over 30 minutes  SIGNED:   Phillips Climes, MD  [facility] 08/05/2021, 2:51 PM Pager   If 7PM-7AM, please contact night-coverage [email or web address] Password [email or web address]

## 2021-08-05 NOTE — Assessment & Plan Note (Signed)
·   Weekly methotrexate with daily sulfasalazine  Outpatient follow-up

## 2021-08-05 NOTE — Progress Notes (Signed)
SLP Cancellation Note  Patient Details Name: Jimmy Williams MRN: [id number] DOB: 10-Sep-1940   Cancelled treatment:       Reason Eval/Treat Not Completed: SLP screened, no acute needs identified in regards to speech, language or cognition. Pt reports he is at baseline function. Will sign off.     [facility], [place], [place] [facility] Office Number: [phone]  Acie Fredrickson 08/05/2021, 9:56 AM

## 2021-08-05 NOTE — Assessment & Plan Note (Signed)
·   Patient presenting with 2 episodes of slurred speech, most recently occurring today.  Patient is now neurologically at baseline suggestive of a recurrent TIA  Per neurology recommendations patient is being hospitalized for further neurologic work-up  Performing serial neurologic checks  Monitoring patient on telemetry  Initiating antiplatelet therapy including 650 mg of aspirin now followed by 81 mg of aspirin daily per neurology recommendations.  Daily statin therapy will be initiated per neurology recommendations  Further imaging to include: Noncontrast MRI of the brain, CT angiogram of the head and neck  Obtaining hemoglobin A1c and lipid panel   Echocardiogram in the morning  PT, OT, SLP evaluation  Permissive hypertension with as needed antihypertensives only to be given if blood pressure greater than 220/115.  If MRI reveals no evidence of stroke then parameters will be reduced to 180/110.  Neurology following in consultation.

## 2021-08-08 ENCOUNTER — Telehealth (HOSPITAL_COMMUNITY): Payer: Self-pay

## 2021-08-09 ENCOUNTER — Other Ambulatory Visit (HOSPITAL_COMMUNITY): Payer: Self-pay

## 2021-08-09 ENCOUNTER — Telehealth (HOSPITAL_COMMUNITY): Payer: Self-pay

## 2021-08-09 DIAGNOSIS — G459 Transient cerebral ischemic attack, unspecified: Secondary | ICD-10-CM | POA: Diagnosis not present

## 2021-08-09 NOTE — Telephone Encounter (Signed)
Pharmacy Transitions of Care Follow-up Telephone Call  Date of discharge: 08/05/2021  Discharge Diagnosis: TIA  How have you been since you were released from the hospital? Patient is doing well.   Medication changes made at discharge:  - START: , aspirin, atorvastatin, clopidogrel  Medication changes verified by the patient? Yes  Medication Accessibility:  Home Pharmacy: [facility] [address]   Was the patient provided with refills on discharged medications? No    Medication Review; CLOPIDOGREL (PLAVIX) Clopidogrel 75 mg once daily.  - Educated patient on expected duration of therapy with clopidogrel. Advised patient that aspirin will be continued indefinitely.  - Reviewed potential DDIs with patient  - Advised patient of medications to avoid (NSAIDs, ASA)  - Educated that Tylenol (acetaminophen) will be the preferred analgesic to prevent risk of bleeding  - Emphasized importance of monitoring for signs and symptoms of bleeding (abnormal bruising, prolonged bleeding, nose bleeds, bleeding from gums, discolored urine, black tarry stools)  - Advised patient to alert all providers of anticoagulation therapy prior to starting a new medication or having a procedure   Follow-up Appointments:  PCP Hospital f/u appt confirmed? Yes Scheduled to see PCP on 2/14.  [facility] f/u appt confirmed? Yes Scheduled to see cardiology.   If their condition worsens, is the pt aware to call PCP or go to the Emergency Dept.? Yes  Final Patient Assessment: Per patient, he is doing well today. He is having no issues with the plavix or the aspirin. We did review the "BE FAST" acronym for s/sx of stroke. He has a follow up appointment with his PCP today, has a scheduled visit with cardiology soon, and is still waiting follow up with neurology.

## 2021-08-11 NOTE — Telephone Encounter (Deleted)
Already spoken with patient.

## 2021-08-12 ENCOUNTER — Ambulatory Visit (INDEPENDENT_AMBULATORY_CARE_PROVIDER_SITE_OTHER): Payer: Medicare HMO

## 2021-08-12 DIAGNOSIS — G459 Transient cerebral ischemic attack, unspecified: Secondary | ICD-10-CM

## 2021-08-12 DIAGNOSIS — Z0000 Encounter for general adult medical examination without abnormal findings: Secondary | ICD-10-CM | POA: Diagnosis not present

## 2021-08-12 DIAGNOSIS — I4891 Unspecified atrial fibrillation: Secondary | ICD-10-CM

## 2021-08-12 DIAGNOSIS — N529 Male erectile dysfunction, unspecified: Secondary | ICD-10-CM | POA: Diagnosis not present

## 2021-08-12 DIAGNOSIS — R9431 Abnormal electrocardiogram [ECG] [EKG]: Secondary | ICD-10-CM | POA: Diagnosis not present

## 2021-08-12 DIAGNOSIS — Z85820 Personal history of malignant melanoma of skin: Secondary | ICD-10-CM | POA: Diagnosis not present

## 2021-08-12 DIAGNOSIS — M069 Rheumatoid arthritis, unspecified: Secondary | ICD-10-CM | POA: Diagnosis not present

## 2021-08-12 DIAGNOSIS — I10 Essential (primary) hypertension: Secondary | ICD-10-CM | POA: Diagnosis not present

## 2021-09-06 DIAGNOSIS — M0589 Other rheumatoid arthritis with rheumatoid factor of multiple sites: Secondary | ICD-10-CM | POA: Diagnosis not present

## 2021-09-09 DIAGNOSIS — I10 Essential (primary) hypertension: Secondary | ICD-10-CM | POA: Diagnosis not present

## 2021-09-20 ENCOUNTER — Other Ambulatory Visit: Payer: Self-pay | Admitting: Student

## 2021-09-20 DIAGNOSIS — G459 Transient cerebral ischemic attack, unspecified: Secondary | ICD-10-CM

## 2021-09-20 DIAGNOSIS — I4891 Unspecified atrial fibrillation: Secondary | ICD-10-CM

## 2021-09-20 DIAGNOSIS — R9431 Abnormal electrocardiogram [ECG] [EKG]: Secondary | ICD-10-CM

## 2021-09-26 NOTE — Progress Notes (Signed)
?Cardiology Office Note:   ? ?Date:  09/27/2021  ? ?ID:  Jimmy Williams, DOB Nov 20, 1940, MRN [id number] ? ?PCP:  Seward Carol, MD  ?Cardiologist:  None   ? ?Referring MD: Seward Carol, MD  ? ?No chief complaint on file. ? ? ?History of Present Illness:   ? ?Jimmy Williams is a 81 y.o. male with a hx of hypertension, TIA, and rheumatoid arthritis here for evaluation of TIA. He was seen in the ED 07/2021 with sudden onset of slurred speech. MRI showed no acute findings but had generalized volume loss and chronic small vessel disease. Echo revealed LVEF 60-655 and grade 1 diastolic dysfunction. Bubble study was negative. He wore a 30-day monitor that showed PVCs and no other arrhythmias.  ? ?Today, he is accompanied by his wife and doing well. He had fallen off a ladder. Later, he had an episode of slurred speech and presented to the ED. He admits to not starting atorvastatin because of the reputation for myalgias. He is amenable to starting it. He is working on diet and exercise instead. At home, his blood pressure has been elevated and averaging in the 882C systolic. For activity, he enjoys walking at least 30 minutes 3 days a week. His diet has improved since his wife is eating more plant-based. He does enjoy occasional sweets. He denies any palpitations, chest pain, or shortness of breath, lightheadedness, headaches, syncope, orthopnea, PND, lower extremity edema or exertional symptoms. ? ?Past Medical History:  ?Diagnosis Date  ? Essential hypertension 08/04/2021  ? Hx of colonic polyps   ? Rheumatoid arthritis(714.0)   ? ? ?Past Surgical History:  ?Procedure Laterality Date  ? ROTATOR CUFF REPAIR    ? right  ? ? ?Current Medications: ?Current Meds  ?Medication Sig  ? aspirin 81 MG chewable tablet Chew 1 tablet (81 mg total) by mouth daily.  ? atorvastatin (LIPITOR) 20 MG tablet Take 1 tablet (20 mg total) by mouth daily.  ? B-D TB SYRINGE 1CC/27GX1/2" 27G X 1/2" 1 ML MISC   ? cetirizine (ZYRTEC) 10 MG tablet Take  10 mg by mouth daily.    ? Cholecalciferol 25 MCG (1000 UT) tablet Take 1,000 Units by mouth daily.  ? Coenzyme Q10 50 MG CAPS Take 50 mg by mouth daily.  ? folic acid (FOLVITE) 1 MG tablet Take 1 mg by mouth daily. Methotrexate day   ? methotrexate 50 MG/2ML injection Inject 1 mL into the skin once a week.  ? Multiple Vitamin (MULTIVITAMIN) capsule Take 1 capsule by mouth daily.    ? Resveratrol 100 MG CAPS Take 1 capsule by mouth daily.  ? sulfaSALAzine (AZULFIDINE) 500 MG tablet Take 500 mg by mouth daily.    ? [DISCONTINUED] losartan (COZAAR) 25 MG tablet Take 25 mg by mouth daily.  ?  ? ?Allergies:   Celery (apium graveolens var. dulce) skin test  ? ?Social History  ? ?Socioeconomic History  ? Marital status: Married  ?  Spouse name: Not on file  ? Number of children: Not on file  ? Years of education: Not on file  ? Highest education level: Not on file  ?Occupational History  ? Not on file  ?Tobacco Use  ? Smoking status: Former  ? Smokeless tobacco: Former  ?Substance and Sexual Activity  ? Alcohol use: Not on file  ? Drug use: Not on file  ? Sexual activity: Not on file  ?Other Topics Concern  ? Not on file  ?Social History Narrative  ?  Occupation:  "Nature conservation officer  ? Never Smoked  ? Widowed twice  ? Alcohol use- yes  ? Regular exercise- yes 25 minutes cardio daily  ? ?Social Determinants of Health  ? ?Financial Resource Strain: Low Risk   ? Difficulty of Paying Living Expenses: Not hard at all  ?Food Insecurity: No Food Insecurity  ? Worried About Charity fundraiser in the Last Year: Never true  ? Ran Out of Food in the Last Year: Never true  ?Transportation Needs: No Transportation Needs  ? Lack of Transportation (Medical): No  ? Lack of Transportation (Non-Medical): No  ?Physical Activity: Inactive  ? Days of Exercise per Week: 0 days  ? Minutes of Exercise per Session: 0 min  ?Stress: Not on file  ?Social Connections: Not on file  ?  ? ?Family History: ?The patient's  family history is negative for Heart disease. ? ?ROS:   ?Please see the history of present illness.    ?All other systems reviewed and negative.  ? ?EKGs/Labs/Other Studies Reviewed:   ? ?The following studies were reviewed today: ?Monitor 09/20/21 ?30 Day Event Monitor ?  ?Quality: Fair.  Baseline artifact. ?Predominant rhythm: sinus rhythm ?Average heart rate: 92 bpm ?Max heart rate: 172 bpm ?Min heart rate: 62 bpm ?Pauses >2.5 seconds: none ?  ?Occasional PVCs ?No significant arrhythmias ? ?Echo 08/05/21 ? 1. Left ventricular ejection fraction, by estimation, is 60 to 65%. The  ?left ventricle has normal function. The left ventricle has no regional  ?wall motion abnormalities. Left ventricular diastolic parameters are  ?consistent with Grade I diastolic  ?dysfunction (impaired relaxation).  ? 2. Right ventricular systolic function is normal. The right ventricular  ?size is normal. There is normal pulmonary artery systolic pressure. The  ?estimated right ventricular systolic pressure is 09.3 mmHg.  ? 3. The mitral valve is normal in structure. Trivial mitral valve  ?regurgitation. No evidence of mitral stenosis.  ? 4. The aortic valve is tricuspid. There is mild calcification of the  ?aortic valve. Aortic valve regurgitation is not visualized. Aortic valve  ?sclerosis is present, with no evidence of aortic valve stenosis.  ? 5. The inferior vena cava is normal in size with greater than 50%  ?respiratory variability, suggesting right atrial pressure of 3 mmHg.  ? 6. Agitated saline contrast bubble study was negative, with no evidence  ?of any interatrial shunt.  ? ?Conclusion(s)/Recommendation(s): No intracardiac source of embolism detected on this transthoracic study. Consider a transesophageal echocardiogram to exclude cardiac source of embolism if clinically indicated.  ? ?MRI Brain 08/04/21 ?IMPRESSION: ?1. No acute intracranial abnormality. ?2. Generalized volume loss and findings of chronic small vessel  disease ? ?CT Head/Neck 08/04/21 ?IMPRESSION: ?CT HEAD IMPRESSION: ?  ?1. No acute intracranial abnormality. ?2. Mild age-related cerebral atrophy with chronic small vessel ?ischemic disease. ?  ?CTA HEAD AND NECK IMPRESSION: ?  ?1. Negative CTA of the head and neck. No large vessel occlusion or other acute vascular abnormality. ?2. Mild for age atheromatous change about the carotid bifurcations and carotid siphons without hemodynamically significant stenosis. ? ?MRI Brain 07/23/21 ?IMPRESSION: ?1. No acute intracranial abnormality. ?2. Generalized volume loss and findings of chronic small vessel disease ? ?EKG:  EKG was not ordered today ? ?Recent Labs: ?08/05/2021: ALT 40; BUN 12; Creatinine, Ser 0.88; Hemoglobin 14.5; Magnesium 2.1; Platelets 175; Potassium 3.3; Sodium 137  ? ?Recent Lipid Panel ?   ?Component Value Date/Time  ? CHOL 183 08/04/2021 1947  ? TRIG 86 08/04/2021  1947  ? HDL 63 08/04/2021 1947  ? CHOLHDL 2.9 08/04/2021 1947  ? VLDL 17 08/04/2021 1947  ? [place] 103 (H) 08/04/2021 1947  ? ? ? ?Physical Exam:   ? ?VS:  BP (!) 130/94 (BP Location: Left Arm, Patient Position: Sitting, Cuff Size: Normal)   Pulse 88   Ht '5\' 7"'$  (1.702 m)   Wt 187 lb 6.4 oz (85 kg)   BMI 29.35 kg/m?  , BMI Body mass index is 29.35 kg/m?. ?GENERAL:  Well appearing ?HEENT: Pupils equal round and reactive, fundi not visualized, oral mucosa unremarkable ?NECK:  No jugular venous distention, waveform within normal limits, carotid upstroke brisk and symmetric, no bruits, no thyromegaly ?LUNGS:  Clear to auscultation bilaterally ?HEART:  RRR.  PMI not displaced or sustained,S1 and S2 within normal limits, no S3, no S4, no clicks, no rubs, no murmurs ?ABD:  Flat, positive bowel sounds normal in frequency in pitch, no bruits, no rebound, no guarding, no midline pulsatile mass, no hepatomegaly, no splenomegaly ?EXT:  2 plus pulses throughout, no edema, no cyanosis no clubbing ?SKIN:  No rashes no nodules ?NEURO:  Cranial nerves II  through XII grossly intact, motor grossly intact throughout ?PSYCH:  Cognitively intact, oriented to person place and time ? ?ASSESSMENT:   ? ?1. Therapeutic drug monitoring   ?2. Essential hypertension   ?3. TIA (transie

## 2021-09-27 ENCOUNTER — Ambulatory Visit (HOSPITAL_BASED_OUTPATIENT_CLINIC_OR_DEPARTMENT_OTHER): Payer: Medicare HMO | Admitting: Cardiovascular Disease

## 2021-09-27 ENCOUNTER — Encounter (HOSPITAL_BASED_OUTPATIENT_CLINIC_OR_DEPARTMENT_OTHER): Payer: Self-pay | Admitting: Cardiovascular Disease

## 2021-09-27 ENCOUNTER — Ambulatory Visit: Payer: Medicare HMO | Admitting: Cardiovascular Disease

## 2021-09-27 VITALS — BP 130/94 | HR 88 | Ht 67.0 in | Wt 187.4 lb

## 2021-09-27 DIAGNOSIS — H02052 Trichiasis without entropian right lower eyelid: Secondary | ICD-10-CM | POA: Diagnosis not present

## 2021-09-27 DIAGNOSIS — Z5181 Encounter for therapeutic drug level monitoring: Secondary | ICD-10-CM

## 2021-09-27 DIAGNOSIS — H2513 Age-related nuclear cataract, bilateral: Secondary | ICD-10-CM | POA: Diagnosis not present

## 2021-09-27 DIAGNOSIS — E7800 Pure hypercholesterolemia, unspecified: Secondary | ICD-10-CM | POA: Diagnosis not present

## 2021-09-27 DIAGNOSIS — I10 Essential (primary) hypertension: Secondary | ICD-10-CM | POA: Diagnosis not present

## 2021-09-27 DIAGNOSIS — G459 Transient cerebral ischemic attack, unspecified: Secondary | ICD-10-CM

## 2021-09-27 MED ORDER — ATORVASTATIN CALCIUM 20 MG PO TABS: 20.0000 mg | ORAL_TABLET | Freq: Every day | ORAL | 3 refills | Status: DC

## 2021-09-27 MED ORDER — LOSARTAN POTASSIUM 50 MG PO TABS: 50.0000 mg | ORAL_TABLET | Freq: Every day | ORAL | 3 refills | Status: DC

## 2021-09-27 NOTE — Patient Instructions (Addendum)
Medication Instructions:  ?START ATORVASTATIN 20 MG DAILY  ? ?INCREASE LOSARTAN TO 50 MG DAILY  ? ?*If you need a refill on your cardiac medications before your next appointment, please call your pharmacy* ? ?Lab Work: ?BMET IN 1 WEEK  ? ?FASTING LP/CMET WHEN YOU RETURN IN 2 MONTHS  ? ?If you have labs (blood work) drawn today and your tests are completely normal, you will receive your results only by: ?MyChart Message (if you have MyChart) OR ?A paper copy in the mail ?If you have any lab test that is abnormal or we need to change your treatment, we will call you to review the results. ? ?Testing/Procedures: ?NONE ? ?Follow-Up: ?At [facility], you and your health needs are our priority.  As part of our continuing mission to provide you with exceptional heart care, we have created designated Provider Care Teams.  These Care Teams include your primary Cardiologist (physician) and Advanced Practice Providers (APPs -  Physician Assistants and Nurse Practitioners) who all work together to provide you with the care you need, when you need it. ? ?We recommend signing up for the patient portal called "MyChart".  Sign up information is provided on this After Visit Summary.  MyChart is used to connect with patients for Virtual Visits (Telemedicine).  Patients are able to view lab/test results, encounter notes, upcoming appointments, etc.  Non-urgent messages can be sent to your provider as well.   ?To learn more about what you can do with MyChart, go to [email or web address].   ? ?Your next appointment:   ?6 month(s) ? ?The format for your next appointment:   ?In Person ? ?Provider:   ?Skeet Latch, MD{ ? ?2 MONTHS WITH CAITLIN W NP (AM APPOINTMENT WILL NEED FASTING LABS) ? ?Other Instructions ? ?MONITOR AND LOG YOUR BLOOD PRESSURE DAILY. BRING READINGS AND MACHINE TO FOLLOW UP  ? ?DR MARK PERINI  ?DR Garret Reddish ?[facility] [phone] ?

## 2021-09-27 NOTE — Assessment & Plan Note (Signed)
BP has been elevated at home and DBP is high here today.  We will increase his losartan to 50 mg.  Check a basic metabolic panel in a week.  He will keep checking his blood pressure and bring his machine to follow-up in 2 months. ?

## 2021-09-27 NOTE — Assessment & Plan Note (Addendum)
Start atorvastatin 20 mg.  Lipids and a CMP in 2 months.  Encouraged him to increase his exercise to 150 minutes/week or more. ?

## 2021-09-27 NOTE — Assessment & Plan Note (Signed)
He had concerning TIA symptoms.  Imaging revealed microvascular disease.  We discussed his LDL goal being less than 70.  He has not been taking his atorvastatin but agrees to start taking 20 mg.  Repeat lipids and a CMP in 2 months.  Continue aspirin. ?

## 2021-09-28 ENCOUNTER — Ambulatory Visit: Payer: Medicare HMO | Admitting: Adult Health

## 2021-09-28 ENCOUNTER — Encounter: Payer: Self-pay | Admitting: Adult Health

## 2021-09-28 VITALS — BP 137/87 | HR 67 | Ht 67.0 in | Wt 184.0 lb

## 2021-09-28 DIAGNOSIS — G459 Transient cerebral ischemic attack, unspecified: Secondary | ICD-10-CM

## 2021-09-28 DIAGNOSIS — Z09 Encounter for follow-up examination after completed treatment for conditions other than malignant neoplasm: Secondary | ICD-10-CM | POA: Diagnosis not present

## 2021-09-28 NOTE — Patient Instructions (Addendum)
Continue aspirin 81 mg daily  and atorvastatin for secondary stroke prevention ? ?Continue to follow up with PCP regarding cholesterol and blood pressure management  ?Maintain strict control of hypertension with blood pressure goal below 130/90 and cholesterol with LDL cholesterol (bad cholesterol) goal below 70 mg/dL.  ? ?Signs of a Stroke? Follow the BEFAST method:  ?Balance Watch for a sudden loss of balance, trouble with coordination or vertigo ?Eyes Is there a sudden loss of vision in one or both eyes? Or double vision?  ?Face: Ask the person to smile. Does one side of the face droop or is it numb?  ?Arms: Ask the person to raise both arms. Does one arm drift downward? Is there weakness or numbness of a leg? ?Speech: Ask the person to repeat a simple phrase. Does the speech sound slurred/strange? Is the person confused ? ?Time: If you observe any of these signs, call 911. ? ? ? ? ? ? ? ?Thank you for coming to see [place] at [facility]. I hope we have been able to provide you high quality care today. ? ?You may receive a patient satisfaction survey over the next few weeks. We would appreciate your feedback and comments so that we may continue to improve ourselves and the health of our patients. ? ? ?Stroke Prevention ?Some medical conditions and lifestyle choices can lead to a higher risk for a stroke. You can help to prevent a stroke by eating healthy foods and exercising. It also helps to not smoke and to manage any health problems you may have. ?How can this condition affect me? ?A stroke is an emergency. It should be treated right away. A stroke can lead to brain damage or threaten your life. There is a better chance of surviving and getting better after a stroke if you get medical help right away. ?What can increase my risk? ?The following medical conditions may increase your risk of a stroke: ?Diseases of the heart and blood vessels (cardiovascular disease). ?High blood pressure  (hypertension). ?Diabetes. ?High cholesterol. ?Sickle cell disease. ?Problems with blood clotting. ?Being very overweight. ?Sleeping problems (obstructivesleep apnea). ?Other risk factors include: ?Being older than age 38. ?A history of blood clots, stroke, or mini-stroke (TIA). ?Race, ethnic background, or a family history of stroke. ?Smoking or using tobacco products. ?Taking birth control pills, especially if you smoke. ?Heavy alcohol and drug use. ?Not being active. ?What actions can I take to prevent this? ?Manage your health conditions ?High cholesterol. ?Eat a healthy diet. If this is not enough to manage your cholesterol, you may need to take medicines. ?Take medicines as told by your doctor. ?High blood pressure. ?Try to keep your blood pressure below 130/80. ?If your blood pressure cannot be managed through a healthy diet and regular exercise, you may need to take medicines. ?Take medicines as told by your doctor. ?Ask your doctor if you should check your blood pressure at home. ?Have your blood pressure checked every year. ?Diabetes. ?Eat a healthy diet and get regular exercise. If your blood sugar (glucose) cannot be managed through diet and exercise, you may need to take medicines. ?Take medicines as told by your doctor. ?Talk to your doctor about getting checked for sleeping problems. Signs of a problem can include: ?Snoring a lot. ?Feeling very tired. ?Make sure that you manage any other conditions you have. ?Nutrition ? ?Follow instructions from your doctor about what to eat or drink. You may be told to: ?Eat and drink fewer calories each day. ?  Limit how much salt (sodium) you use to 1,500 milligrams (mg) each day. ?Use only healthy fats for cooking, such as olive oil, canola oil, and sunflower oil. ?Eat healthy foods. To do this: ?Choose foods that are high in fiber. These include whole grains, and fresh fruits and vegetables. ?Eat at least 5 servings of fruits and vegetables a day. Try to fill  one-half of your plate with fruits and vegetables at each meal. ?Choose low-fat (lean) proteins. These include low-fat cuts of meat, chicken without skin, fish, tofu, beans, and nuts. ?Eat low-fat dairy products. ?Avoid foods that: ?Are high in salt. ?Have saturated fat. ?Have trans fat. ?Have cholesterol. ?Are processed or pre-made. ?Count how many carbohydrates you eat and drink each day. ?Lifestyle ?If you drink alcohol: ?Limit how much you have to: ?0-1 drink a day for women who are not pregnant. ?0-2 drinks a day for men. ?Know how much alcohol is in your drink. In the [place] one drink equals one 12 oz bottle of beer (353m), one 5 oz glass of wine (1463m, or one 1? oz glass of hard liquor (4465m ?Do not smoke or use any products that have nicotine or tobacco. If you need help quitting, ask your doctor. ?Avoid secondhand smoke. ?Do not use drugs. ?Activity ? ?Try to stay at a healthy weight. ?Get at least 30 minutes of exercise on most days, such as: ?Fast walking. ?Biking. ?Swimming. ?Medicines ?Take over-the-counter and prescription medicines only as told by your doctor. ?Avoid taking birth control pills. Talk to your doctor about the risks of taking birth control pills if: ?You are over 35 75ars old. ?You smoke. ?You get very bad headaches. ?You have had a blood clot. ?Where to find more information ?American Stroke Association: www.strokeassociation.org ?Get help right away if: ?You or a loved one has any signs of a stroke. "BE FAST" is an easy way to remember the warning signs: ?B - Balance. Dizziness, sudden trouble walking, or loss of balance. ?E - Eyes. Trouble seeing or a change in how you see. ?F - Face. Sudden weakness or loss of feeling of the face. The face or eyelid may droop on one side. ?A - Arms. Weakness or loss of feeling in an arm. This happens all of a sudden and most often on one side of the body. ?S - Speech. Sudden trouble speaking, slurred speech, or trouble understanding what people  say. ?T - Time. Time to call emergency services. Write down what time symptoms started. ?You or a loved one has other signs of a stroke, such as: ?A sudden, very bad headache with no known cause. ?Feeling like you may vomit (nausea). ?Vomiting. ?A seizure. ?These symptoms may be an emergency. Get help right away. Call your local emergency services (911 in the [place] ?Do not wait to see if the symptoms will go away. ?Do not drive yourself to the hospital. ?Summary ?You can help to prevent a stroke by eating healthy, exercising, and not smoking. It also helps to manage any health problems you have. ?Do not smoke or use any products that contain nicotine or tobacco. ?Get help right away if you or a loved one has any signs of a stroke. ?This information is not intended to replace advice given to you by your health care provider. Make sure you discuss any questions you have with your health care provider. ?Document Revised: 01/12/2020 Document Reviewed: 01/12/2020 ?Elsevier Patient Education ? 202[place] ?

## 2021-09-28 NOTE — Progress Notes (Signed)
?[facility] ?[address] ?[place]. [place] [address] ?[phone] ? ?     HOSPITAL FOLLOW UP NOTE ? ?Mr. Derrek Gu Handrich ?Date of Birth:  Jul 08, 1940 ?Medical Record Number:  [id number]  ? ?Reason for Referral:  hospital stroke follow up ? ? ? ?SUBJECTIVE: ? ? ?CHIEF COMPLAINT:  ?Chief Complaint  ?Patient presents with  ? Follow-up  ?  Rm 3 alone ?Pt is well and stable, no concerns   ? ? ?HPI:  ? ?Mr. MILLS MITTON is a 81 y.o. male with history of hypertension and rheumatoid arthritis who presented on 08/04/2021 with a transient episode of dysphagia and dysarthria vs aphasia lasting approximately 4 minutes after a fall hitting his head.  Personally reviewed hospitalization pertinent progress notes, lab work and imaging.  Evaluated by Dr. Erlinda Hong for likely TIA with unclear exact etiology. Per report, similar episode 2 weeks prior after hitting his head on a door frame with CT/MRI negative at that time.  Recommended 30-day cardiac event monitor to rule out A-fib.  On this admission, MRI no acute infarct.  CTA negative LVO.  EF 60 to 65%.  EEG unremarkable.  Recommended DAPT for 3 weeks and aspirin alone.  LDL 103, initiate atorvastatin 40 mg daily.  A1c 4.5.  Other stroke risk factors include advanced age, remote tobacco use and history of stroke/TIA.  ? ? ?Today, 09/28/2021, patient is being seen for initial hospital follow-up unaccompanied.  Overall stable without new or reoccurring stroke/TIA symptoms.  He has returned back to all prior activities without difficulty, maintains ADLs and IADLs independently, continues to drive.  Completed 3 weeks DAPT and remains on aspirin alone without side effects.  He did not start atorvastatin after discharge as recommended due to concern of side effects, seen by cardiologist Dr. Oval Linsey yesterday and has since started on atorvastatin 20 mg daily.  Blood pressure today 137/87. Monitors at home which has been stable since adjusting losartan dosage recently with  PCP. Completed 30-day cardiac event monitor which was negative for A-fib.  No concerns at this time. ? ? ? ?PERTINENT IMAGING ? ?Per hospitalization 08/04/2021 ?Code Stroke - CT head No acute abnormality. ASPECTS 10.    ?CTA head & neck- No large vessel occlusion or other acute vascular abnormality. Mild for age atheromatous change about the carotid bifurcations and carotid siphons without hemodynamically significant stenosis.  ?MRI  No acute intracranial abnormality. Generalized volume loss and findings of chronic small vessel disease. ?2D Echo EF 60-65%, no shunt detected ?EEG-negative ?LDL 103 ?HgbA1c 4.5 ? ? ? ? ?ROS:   ?14 system review of systems performed and negative with exception of those listed in HPI ? ?PMH:  ?Past Medical History:  ?Diagnosis Date  ? Essential hypertension 08/04/2021  ? Hx of colonic polyps   ? Rheumatoid arthritis(714.0)   ? ? ?PSH:  ?Past Surgical History:  ?Procedure Laterality Date  ? ROTATOR CUFF REPAIR    ? right  ? ? ?Social History:  ?Social History  ? ?Socioeconomic History  ? Marital status: Married  ?  Spouse name: Not on file  ? Number of children: Not on file  ? Years of education: Not on file  ? Highest education level: Not on file  ?Occupational History  ? Not on file  ?Tobacco Use  ? Smoking status: Former  ? Smokeless tobacco: Former  ?Substance and Sexual Activity  ? Alcohol use: Not on file  ? Drug use: Not on file  ? Sexual activity: Not on  file  ?Other Topics Concern  ? Not on file  ?Social History Narrative  ? Occupation:  "Nature conservation officer  ? Never Smoked  ? Widowed twice  ? Alcohol use- yes  ? Regular exercise- yes 25 minutes cardio daily  ? ?Social Determinants of Health  ? ?Financial Resource Strain: Low Risk   ? Difficulty of Paying Living Expenses: Not hard at all  ?Food Insecurity: No Food Insecurity  ? Worried About Charity fundraiser in the Last Year: Never true  ? Ran Out of Food in the Last Year: Never true  ?Transportation  Needs: No Transportation Needs  ? Lack of Transportation (Medical): No  ? Lack of Transportation (Non-Medical): No  ?Physical Activity: Inactive  ? Days of Exercise per Week: 0 days  ? Minutes of Exercise per Session: 0 min  ?Stress: Not on file  ?Social Connections: Not on file  ?Intimate Partner Violence: Not on file  ? ? ?Family History:  ?Family History  ?Problem Relation Age of Onset  ? Heart disease Neg Hx   ? ? ?Medications:   ?Current Outpatient Medications on File Prior to Visit  ?Medication Sig Dispense Refill  ? aspirin 81 MG chewable tablet Chew 1 tablet (81 mg total) by mouth daily.    ? atorvastatin (LIPITOR) 20 MG tablet Take 1 tablet (20 mg total) by mouth daily. 90 tablet 3  ? B-D TB SYRINGE 1CC/27GX1/2" 27G X 1/2" 1 ML MISC     ? cetirizine (ZYRTEC) 10 MG tablet Take 10 mg by mouth daily.      ? Cholecalciferol 25 MCG (1000 UT) tablet Take 1,000 Units by mouth daily.    ? Coenzyme Q10 50 MG CAPS Take 50 mg by mouth daily.    ? folic acid (FOLVITE) 1 MG tablet Take 1 mg by mouth daily. Methotrexate day     ? losartan (COZAAR) 50 MG tablet Take 1 tablet (50 mg total) by mouth daily. 90 tablet 3  ? methotrexate 50 MG/2ML injection Inject 1 mL into the skin once a week.    ? Multiple Vitamin (MULTIVITAMIN) capsule Take 1 capsule by mouth daily.      ? Resveratrol 100 MG CAPS Take 1 capsule by mouth daily.    ? SULFASALAZINE PO Take 100 mg by mouth 2 (two) times daily.    ? ?No current facility-administered medications on file prior to visit.  ? ? ?Allergies:   ?Allergies  ?Allergen Reactions  ? Celery (Apium Graveolens Var. Dulce) Skin Test   ?  "Finger and tongue swell"   ? ? ? ? ?OBJECTIVE: ? ?Physical Exam ? ?Vitals:  ? 09/28/21 0909  ?BP: 137/87  ?Pulse: 67  ?Weight: 184 lb (83.5 kg)  ?Height: '5\' 7"'$  (1.702 m)  ? ?Body mass index is 28.82 kg/m?Marland Kitchen ?No results found. ? ?Post stroke PHQ 2/9 ? ?  09/28/2021  ?  9:13 AM  ?Depression screen PHQ 2/9  ?Decreased Interest 0  ?Down, Depressed, Hopeless 0  ?PHQ -  2 Score 0  ?  ? ?General: well developed, well nourished, very pleasant elderly Caucasian male, seated, in no evident distress ?Head: head normocephalic and atraumatic.   ?Neck: supple with no carotid or supraclavicular bruits ?Cardiovascular: regular rate and rhythm, no murmurs ?Musculoskeletal: no deformity ?Skin:  no rash/petichiae ?Vascular:  Normal pulses all extremities ?  ?Neurologic Exam ?Mental Status: Awake and fully alert.  Fluent speech and language.  Oriented to place and time. Recent and remote memory intact.  Attention span, concentration and fund of knowledge appropriate. Mood and affect appropriate.  ?Cranial Nerves: Fundoscopic exam reveals sharp disc margins. Pupils equal, briskly reactive to light. Extraocular movements full without nystagmus. Visual fields full to confrontation. Hearing intact. Facial sensation intact. Face, tongue, palate moves normally and symmetrically.  ?Motor: Normal bulk and tone. Normal strength in all tested extremity muscles ?Sensory.: intact to touch , pinprick , position and vibratory sensation.  ?Coordination: Rapid alternating movements normal in all extremities. Finger-to-nose and heel-to-shin performed accurately bilaterally. ?Gait and Station: Arises from chair without difficulty. Stance is normal. Gait demonstrates normal stride length and balance with without use of assistive device. Tandem walk and heel toe with mild difficulty.  ?Reflexes: 1+ and symmetric. Toes downgoing.  ? ? ? ?NIHSS  0 ?Modified Rankin  0 ? ? ? ? ? ?ASSESSMENT: AADI BORDNER is a 81 y.o. year old male with TIA on 08/04/2021 after presenting with transient episode of dysphagia and dysarthria vs aphasia after a fall hitting his head with similar episode 2 weeks prior after hitting head on a door frame.  Unclear exact etiology per Dr. Erlinda Hong.  Vascular risk factors include HTN, HLD, advanced age, former tobacco use and history of stroke/TIA.  ? ? ? ? ?PLAN: ? ?TIA:  ?Continue aspirin 81 mg daily   and atorvastatin 20 mg daily for secondary stroke prevention.   ?Cardiac monitor negative for atrial fibrillation ?Discussed secondary stroke prevention measures and importance of close PCP follow up for

## 2021-09-29 NOTE — Progress Notes (Signed)
I agree with the above plan 

## 2021-10-05 DIAGNOSIS — Z5181 Encounter for therapeutic drug level monitoring: Secondary | ICD-10-CM | POA: Diagnosis not present

## 2021-10-05 DIAGNOSIS — I10 Essential (primary) hypertension: Secondary | ICD-10-CM | POA: Diagnosis not present

## 2021-10-06 LAB — BASIC METABOLIC PANEL
BUN/Creatinine Ratio: 17 (ref 10–24)
BUN: 15 mg/dL (ref 8–27)
CO2: 24 mmol/L (ref 20–29)
Calcium: 9.1 mg/dL (ref 8.6–10.2)
Chloride: 104 mmol/L (ref 96–106)
Creatinine, Ser: 0.9 mg/dL (ref 0.76–1.27)
Glucose: 99 mg/dL (ref 70–99)
Potassium: 4.4 mmol/L (ref 3.5–5.2)
Sodium: 142 mmol/L (ref 134–144)
eGFR: 87 mL/min/{1.73_m2} (ref >59)

## 2021-10-17 DIAGNOSIS — Z6830 Body mass index (BMI) 30.0-30.9, adult: Secondary | ICD-10-CM | POA: Diagnosis not present

## 2021-10-17 DIAGNOSIS — R5383 Other fatigue: Secondary | ICD-10-CM | POA: Diagnosis not present

## 2021-10-17 DIAGNOSIS — M0589 Other rheumatoid arthritis with rheumatoid factor of multiple sites: Secondary | ICD-10-CM | POA: Diagnosis not present

## 2021-10-17 DIAGNOSIS — M25531 Pain in right wrist: Secondary | ICD-10-CM | POA: Diagnosis not present

## 2021-10-17 DIAGNOSIS — E669 Obesity, unspecified: Secondary | ICD-10-CM | POA: Diagnosis not present

## 2021-10-17 DIAGNOSIS — Z79899 Other long term (current) drug therapy: Secondary | ICD-10-CM | POA: Diagnosis not present

## 2021-11-13 NOTE — Progress Notes (Signed)
Office Visit    Patient Name: Jimmy Williams Date of Encounter: 11/14/2021  PCP:  Seward Carol, MD   [place]  Cardiologist:  Skeet Latch, MD  Advanced Practice Provider:  No care team member to display Electrophysiologist:  None      Chief Complaint    Jimmy Williams is a 81 y.o. male with a hx of hypertension, rheumatoid arthritis, RA, hyperlipidemia presents today for follow-up of hypertension  Past Medical History    Past Medical History:  Diagnosis Date   Essential hypertension 08/04/2021   Hx of colonic polyps    Rheumatoid arthritis(714.0)    Past Surgical History:  Procedure Laterality Date   ROTATOR CUFF REPAIR     right    Allergies  Allergies  Allergen Reactions   Celery (Apium Graveolens Var. Dulce) Skin Test     "Finger and tongue swell"     History of Present Illness    Jimmy Williams is a 81 y.o. male with a hx of hypertension, TIA, rheumatoid arthritis, hyperlipidemia last seen 09/27/2021 by Dr. Oval Linsey.  He was seen in the ED 07/2021 with sudden onset of slurred speech with MRI revealing no acute findings but had generalized volume loss and chronic small vessel disease.  Echo with LVEF 60 to 13%, grade 1 diastolic dysfunction, negative bubble study.  30-day monitor with PVCs and no other arrhythmias.  When last seen 09/27/2021 noted he had not been taking his atorvastatin regularly due to a reputation for myalgias.  He was amenable to begin.  His blood pressure had been elevated at home and diastolic blood pressure elevated in clinic and as such losartan dose was increased.  He presents today for follow-up with his wife.  Since last seen his blood pressure has been well controlled most often at goal of less than 130/80.  He has been continuing to follow a more plant-based diet and exercising daily with the assistance of his wife.  He reports no chest pain, shortness of breath, edema, orthopnea, PND.  He has had  occasional episodes of sharp pain behind his left shoulder blade over the past 2 weeks that occurs at rest and lasts only seconds.  He does endorse that it feels similar to a muscle spasm.  Encouraged to trial heat pack, NSAID and follow-up with PCP if does not resolve.  EKGs/Labs/Other Studies Reviewed:   The following studies were reviewed today: Monitor 09/20/21 30 Day Event Monitor   Quality: Fair.  Baseline artifact. Predominant rhythm: sinus rhythm Average heart rate: 92 bpm Max heart rate: 172 bpm Min heart rate: 62 bpm Pauses >2.5 seconds: none   Occasional PVCs No significant arrhythmias   Echo 08/05/21  1. Left ventricular ejection fraction, by estimation, is 60 to 65%. The  left ventricle has normal function. The left ventricle has no regional  wall motion abnormalities. Left ventricular diastolic parameters are  consistent with Grade I diastolic  dysfunction (impaired relaxation).   2. Right ventricular systolic function is normal. The right ventricular  size is normal. There is normal pulmonary artery systolic pressure. The  estimated right ventricular systolic pressure is 24.4 mmHg.   3. The mitral valve is normal in structure. Trivial mitral valve  regurgitation. No evidence of mitral stenosis.   4. The aortic valve is tricuspid. There is mild calcification of the  aortic valve. Aortic valve regurgitation is not visualized. Aortic valve  sclerosis is present, with no evidence of aortic valve stenosis.  5. The inferior vena cava is normal in size with greater than 50%  respiratory variability, suggesting right atrial pressure of 3 mmHg.   6. Agitated saline contrast bubble study was negative, with no evidence  of any interatrial shunt.   Conclusion(s)/Recommendation(s): No intracardiac source of embolism detected on this transthoracic study. Consider a transesophageal echocardiogram to exclude cardiac source of embolism if clinically indicated.    MRI Brain  08/04/21 IMPRESSION: 1. No acute intracranial abnormality. 2. Generalized volume loss and findings of chronic small vessel disease   CT Head/Neck 08/04/21 IMPRESSION: CT HEAD IMPRESSION:   1. No acute intracranial abnormality. 2. Mild age-related cerebral atrophy with chronic small vessel ischemic disease.   CTA HEAD AND NECK IMPRESSION:   1. Negative CTA of the head and neck. No large vessel occlusion or other acute vascular abnormality. 2. Mild for age atheromatous change about the carotid bifurcations and carotid siphons without hemodynamically significant stenosis.   MRI Brain 07/23/21 IMPRESSION: 1. No acute intracranial abnormality. 2. Generalized volume loss and findings of chronic small vessel disease  EKG:  No EKG today  Recent Labs: 08/05/2021: ALT 40; Hemoglobin 14.5; Magnesium 2.1; Platelets 175 10/05/2021: BUN 15; Creatinine, Ser 0.90; Potassium 4.4; Sodium 142  Recent Lipid Panel    Component Value Date/Time   CHOL 183 08/04/2021 1947   TRIG 86 08/04/2021 1947   HDL 63 08/04/2021 1947   CHOLHDL 2.9 08/04/2021 1947   VLDL 17 08/04/2021 1947   LDLCALC 103 (H) 08/04/2021 1947   Home Medications   Current Meds  Medication Sig   aspirin 81 MG chewable tablet Chew 1 tablet (81 mg total) by mouth daily.   atorvastatin (LIPITOR) 20 MG tablet Take 1 tablet (20 mg total) by mouth daily.   B-D TB SYRINGE 1CC/27GX1/2" 27G X 1/2" 1 ML MISC    cetirizine (ZYRTEC) 10 MG tablet Take 10 mg by mouth daily.     Cholecalciferol 25 MCG (1000 UT) tablet Take 1,000 Units by mouth daily.   Coenzyme Q10 50 MG CAPS Take 50 mg by mouth daily.   folic acid (FOLVITE) 1 MG tablet Take 1 mg by mouth daily. Methotrexate day    losartan (COZAAR) 50 MG tablet Take 1 tablet (50 mg total) by mouth daily.   methotrexate 50 MG/2ML injection Inject 1 mL into the skin once a week.   Multiple Vitamin (MULTIVITAMIN) capsule Take 1 capsule by mouth daily.     NON FORMULARY Vitamin K2 takes daily    Probiotic Product (PROBIOTIC-10 PO) Take 1 tablet by mouth daily.   Resveratrol 100 MG CAPS Take 1 capsule by mouth daily.   SULFASALAZINE PO Take 100 mg by mouth 2 (two) times daily.     Review of Systems      All other systems reviewed and are otherwise negative except as noted above.  Physical Exam    VS:  BP 114/62   Pulse 77   Ht '5\' 7"'$  (1.702 m)   Wt 186 lb (84.4 kg)   SpO2 99%   BMI 29.13 kg/m  , BMI Body mass index is 29.13 kg/m.  Wt Readings from Last 3 Encounters:  11/14/21 186 lb (84.4 kg)  09/28/21 184 lb (83.5 kg)  09/27/21 187 lb 6.4 oz (85 kg)    GEN: Well nourished, well developed, in no acute distress. HEENT: normal. Neck: Supple, no JVD, carotid bruits, or masses. Cardiac: RRR, no murmurs, rubs, or gallops. No clubbing, cyanosis, edema.  Radials/PT 2+ and equal bilaterally.  Respiratory:  Respirations regular and unlabored, clear to auscultation bilaterally. GI: Soft, nontender, nondistended. MS: No deformity or atrophy. Skin: Warm and dry, no rash. Neuro:  Strength and sensation are intact. Psych: Normal affect.  Assessment & Plan    HTN -since increased losartan dose BP has been well controlled at goal of less than 130/80.  Continue losartan 50 mg daily.  Update CMP today for monitoring of renal function. Heart healthy diet and regular cardiovascular exercise encouraged.    TIA -prior imaging revealed microvascular disease.  Continue aspirin, statin.  HLD, LDL goal <70 -tolerating atorvastatin without myalgias.  Update fasting lipid panel, CMP today.  If LDL not at goal of less than 70 consider increased dose versus addition of Zetia.     Disposition: Follow up in 6 month(s) with Skeet Latch, MD or APP.  Signed, Loel Dubonnet, NP 11/14/2021, 12:32 PM Uncertain

## 2021-11-14 ENCOUNTER — Encounter (HOSPITAL_BASED_OUTPATIENT_CLINIC_OR_DEPARTMENT_OTHER): Payer: Self-pay | Admitting: Family

## 2021-11-14 ENCOUNTER — Ambulatory Visit (HOSPITAL_BASED_OUTPATIENT_CLINIC_OR_DEPARTMENT_OTHER): Payer: Medicare HMO | Admitting: Family

## 2021-11-14 VITALS — BP 114/62 | HR 77 | Ht 67.0 in | Wt 186.0 lb

## 2021-11-14 DIAGNOSIS — E785 Hyperlipidemia, unspecified: Secondary | ICD-10-CM

## 2021-11-14 DIAGNOSIS — G459 Transient cerebral ischemic attack, unspecified: Secondary | ICD-10-CM

## 2021-11-14 DIAGNOSIS — E7800 Pure hypercholesterolemia, unspecified: Secondary | ICD-10-CM

## 2021-11-14 DIAGNOSIS — I10 Essential (primary) hypertension: Secondary | ICD-10-CM | POA: Diagnosis not present

## 2021-11-14 NOTE — Patient Instructions (Signed)
Medication Instructions:  Your Physician recommend you continue on your current medication as directed.    *If you need a refill on your cardiac medications before your next appointment, please call your pharmacy*   Lab Work: Your physician recommends that you return for lab work today- Fasting Lipid Panel and CMET   If you have labs (blood work) drawn today and your tests are completely normal, you will receive your results only by: MyChart Message (if you have [facility]) OR A paper copy in the mail If you have any lab test that is abnormal or we need to change your treatment, we will call you to review the results.   Testing/Procedures: None ordered today    Follow-Up: At [facility], you and your health needs are our priority.  As part of our continuing mission to provide you with exceptional heart care, we have created designated Provider Care Teams.  These Care Teams include your primary Cardiologist (physician) and Advanced Practice Providers (APPs -  Physician Assistants and Nurse Practitioners) who all work together to provide you with the care you need, when you need it.  We recommend signing up for the patient portal called "MyChart".  Sign up information is provided on this After Visit Summary.  MyChart is used to connect with patients for Virtual Visits (Telemedicine).  Patients are able to view lab/test results, encounter notes, upcoming appointments, etc.  Non-urgent messages can be sent to your provider as well.   To learn more about what you can do with MyChart, go to [email or web address].    Your next appointment:   Follow up as scheduled with Dr. Oval Linsey in November   Other Instructions Heart Healthy Diet Recommendations: A low-salt diet is recommended. Meats should be grilled, baked, or boiled. Avoid fried foods. Focus on lean protein sources like fish or chicken with vegetables and fruits. The American Heart Association is a Microbiologist!  American Heart  Association Diet and Lifeystyle Recommendations   Exercise recommendations: The American Heart Association recommends 150 minutes of moderate intensity exercise weekly. Try 30 minutes of moderate intensity exercise 4-5 times per week. This could include walking, jogging, or swimming.   Important Information About Sugar

## 2021-11-15 LAB — COMPREHENSIVE METABOLIC PANEL
ALT: 32 IU/L (ref 0–44)
AST: 27 IU/L (ref 0–40)
Albumin/Globulin Ratio: 1.6 (ref 1.2–2.2)
Albumin: 4.3 g/dL (ref 3.7–4.7)
Alkaline Phosphatase: 76 IU/L (ref 44–121)
BUN/Creatinine Ratio: 11 (ref 10–24)
BUN: 11 mg/dL (ref 8–27)
Bilirubin Total: 0.6 mg/dL (ref 0.0–1.2)
CO2: 24 mmol/L (ref 20–29)
Calcium: 9.3 mg/dL (ref 8.6–10.2)
Chloride: 105 mmol/L (ref 96–106)
Creatinine, Ser: 1.02 mg/dL (ref 0.76–1.27)
Globulin, Total: 2.7 g/dL (ref 1.5–4.5)
Glucose: 94 mg/dL (ref 70–99)
Potassium: 5 mmol/L (ref 3.5–5.2)
Sodium: 144 mmol/L (ref 134–144)
Total Protein: 7 g/dL (ref 6.0–8.5)
eGFR: 75 mL/min/{1.73_m2} (ref >59)

## 2021-11-15 LAB — LIPID PANEL
Chol/HDL Ratio: 1.9 ratio (ref 0.0–5.0)
Cholesterol, Total: 128 mg/dL (ref 100–199)
HDL: 67 mg/dL (ref >39)
LDL Chol Calc (NIH): 51 mg/dL (ref 0–99)
Triglycerides: 38 mg/dL (ref 0–149)
VLDL Cholesterol Cal: 10 mg/dL (ref 5–40)

## 2022-02-24 DIAGNOSIS — M0589 Other rheumatoid arthritis with rheumatoid factor of multiple sites: Secondary | ICD-10-CM | POA: Diagnosis not present

## 2022-03-13 DIAGNOSIS — E782 Mixed hyperlipidemia: Secondary | ICD-10-CM | POA: Diagnosis not present

## 2022-03-13 DIAGNOSIS — I10 Essential (primary) hypertension: Secondary | ICD-10-CM | POA: Diagnosis not present

## 2022-03-14 DIAGNOSIS — I10 Essential (primary) hypertension: Secondary | ICD-10-CM | POA: Diagnosis not present

## 2022-03-14 DIAGNOSIS — E782 Mixed hyperlipidemia: Secondary | ICD-10-CM | POA: Diagnosis not present

## 2022-03-14 DIAGNOSIS — Z0000 Encounter for general adult medical examination without abnormal findings: Secondary | ICD-10-CM | POA: Diagnosis not present

## 2022-03-14 DIAGNOSIS — Z8673 Personal history of transient ischemic attack (TIA), and cerebral infarction without residual deficits: Secondary | ICD-10-CM | POA: Diagnosis not present

## 2022-03-14 DIAGNOSIS — M0579 Rheumatoid arthritis with rheumatoid factor of multiple sites without organ or systems involvement: Secondary | ICD-10-CM | POA: Diagnosis not present

## 2022-03-28 DIAGNOSIS — J029 Acute pharyngitis, unspecified: Secondary | ICD-10-CM | POA: Diagnosis not present

## 2022-03-28 DIAGNOSIS — R000 Tachycardia, unspecified: Secondary | ICD-10-CM | POA: Diagnosis not present

## 2022-03-28 DIAGNOSIS — J90 Pleural effusion, not elsewhere classified: Secondary | ICD-10-CM | POA: Diagnosis not present

## 2022-03-28 DIAGNOSIS — R112 Nausea with vomiting, unspecified: Secondary | ICD-10-CM | POA: Diagnosis not present

## 2022-03-28 DIAGNOSIS — U071 COVID-19: Secondary | ICD-10-CM | POA: Diagnosis not present

## 2022-03-28 DIAGNOSIS — Z87891 Personal history of nicotine dependence: Secondary | ICD-10-CM | POA: Diagnosis not present

## 2022-03-28 DIAGNOSIS — Z8639 Personal history of other endocrine, nutritional and metabolic disease: Secondary | ICD-10-CM | POA: Diagnosis not present

## 2022-03-28 DIAGNOSIS — R509 Fever, unspecified: Secondary | ICD-10-CM | POA: Diagnosis not present

## 2022-03-28 DIAGNOSIS — G459 Transient cerebral ischemic attack, unspecified: Secondary | ICD-10-CM | POA: Diagnosis not present

## 2022-03-28 DIAGNOSIS — R079 Chest pain, unspecified: Secondary | ICD-10-CM | POA: Diagnosis not present

## 2022-04-03 ENCOUNTER — Ambulatory Visit (HOSPITAL_BASED_OUTPATIENT_CLINIC_OR_DEPARTMENT_OTHER): Payer: Self-pay | Admitting: Cardiovascular Disease

## 2022-04-18 DIAGNOSIS — Z6829 Body mass index (BMI) 29.0-29.9, adult: Secondary | ICD-10-CM | POA: Diagnosis not present

## 2022-04-18 DIAGNOSIS — E663 Overweight: Secondary | ICD-10-CM | POA: Diagnosis not present

## 2022-04-18 DIAGNOSIS — M25531 Pain in right wrist: Secondary | ICD-10-CM | POA: Diagnosis not present

## 2022-04-18 DIAGNOSIS — M1991 Primary osteoarthritis, unspecified site: Secondary | ICD-10-CM | POA: Diagnosis not present

## 2022-04-18 DIAGNOSIS — Z79899 Other long term (current) drug therapy: Secondary | ICD-10-CM | POA: Diagnosis not present

## 2022-04-18 DIAGNOSIS — M0589 Other rheumatoid arthritis with rheumatoid factor of multiple sites: Secondary | ICD-10-CM | POA: Diagnosis not present

## 2022-05-05 ENCOUNTER — Ambulatory Visit (HOSPITAL_BASED_OUTPATIENT_CLINIC_OR_DEPARTMENT_OTHER): Payer: Self-pay | Admitting: Cardiovascular Disease

## 2022-05-23 DIAGNOSIS — D696 Thrombocytopenia, unspecified: Secondary | ICD-10-CM | POA: Diagnosis not present

## 2022-06-28 DIAGNOSIS — M0589 Other rheumatoid arthritis with rheumatoid factor of multiple sites: Secondary | ICD-10-CM | POA: Diagnosis not present

## 2022-07-24 DIAGNOSIS — H5213 Myopia, bilateral: Secondary | ICD-10-CM | POA: Diagnosis not present

## 2022-07-24 DIAGNOSIS — H2513 Age-related nuclear cataract, bilateral: Secondary | ICD-10-CM | POA: Diagnosis not present

## 2022-08-23 DIAGNOSIS — D225 Melanocytic nevi of trunk: Secondary | ICD-10-CM | POA: Diagnosis not present

## 2022-08-23 DIAGNOSIS — C44329 Squamous cell carcinoma of skin of other parts of face: Secondary | ICD-10-CM | POA: Diagnosis not present

## 2022-08-23 DIAGNOSIS — L814 Other melanin hyperpigmentation: Secondary | ICD-10-CM | POA: Diagnosis not present

## 2022-08-23 DIAGNOSIS — L821 Other seborrheic keratosis: Secondary | ICD-10-CM | POA: Diagnosis not present

## 2022-09-26 ENCOUNTER — Other Ambulatory Visit (HOSPITAL_BASED_OUTPATIENT_CLINIC_OR_DEPARTMENT_OTHER): Payer: Self-pay | Admitting: Cardiovascular Disease

## 2022-09-26 NOTE — Telephone Encounter (Signed)
Please call pt to schedule overdue follow-up appointment with Dr. Oval Linsey or APP for refills. Was supposed to follow-up 6 months after last OV (10/2021). Thank you!

## 2022-09-28 NOTE — Telephone Encounter (Signed)
Left message for patient to call and schedule overdue follow up with Dr. Oval Linsey / APP for medications refills

## 2022-10-02 ENCOUNTER — Encounter (HOSPITAL_BASED_OUTPATIENT_CLINIC_OR_DEPARTMENT_OTHER): Payer: Self-pay | Admitting: Cardiovascular Disease

## 2022-10-02 DIAGNOSIS — E7800 Pure hypercholesterolemia, unspecified: Secondary | ICD-10-CM

## 2022-10-02 MED ORDER — ATORVASTATIN CALCIUM 20 MG PO TABS: 20.0000 mg | ORAL_TABLET | Freq: Every day | ORAL | 0 refills | Status: DC

## 2022-10-04 NOTE — Telephone Encounter (Signed)
Rx request sent to pharmacy.  

## 2022-10-04 NOTE — Telephone Encounter (Signed)
Scheduled 11/27/22 with Gillian Shields, NP

## 2022-10-10 ENCOUNTER — Encounter (HOSPITAL_BASED_OUTPATIENT_CLINIC_OR_DEPARTMENT_OTHER): Payer: Self-pay

## 2022-10-11 DIAGNOSIS — E7800 Pure hypercholesterolemia, unspecified: Secondary | ICD-10-CM | POA: Diagnosis not present

## 2022-10-12 LAB — HEPATIC FUNCTION PANEL
ALT: 29 IU/L (ref 0–44)
AST: 32 IU/L (ref 0–40)
Albumin: 4.1 g/dL (ref 3.8–4.8)
Alkaline Phosphatase: 65 IU/L (ref 44–121)
Bilirubin Total: 0.7 mg/dL (ref 0.0–1.2)
Bilirubin, Direct: 0.23 mg/dL (ref 0.00–0.40)
Total Protein: 6.7 g/dL (ref 6.0–8.5)

## 2022-10-12 LAB — LIPID PANEL
Chol/HDL Ratio: 1.8 ratio (ref 0.0–5.0)
Cholesterol, Total: 117 mg/dL (ref 100–199)
HDL: 64 mg/dL (ref >39)
LDL Chol Calc (NIH): 40 mg/dL (ref 0–99)
Triglycerides: 58 mg/dL (ref 0–149)
VLDL Cholesterol Cal: 13 mg/dL (ref 5–40)

## 2022-10-16 ENCOUNTER — Encounter (HOSPITAL_BASED_OUTPATIENT_CLINIC_OR_DEPARTMENT_OTHER): Payer: Self-pay | Admitting: Family

## 2022-10-16 ENCOUNTER — Ambulatory Visit (HOSPITAL_BASED_OUTPATIENT_CLINIC_OR_DEPARTMENT_OTHER): Payer: Medicare HMO | Admitting: Family

## 2022-10-16 VITALS — BP 130/78 | HR 82 | Ht 67.0 in | Wt 192.0 lb

## 2022-10-16 DIAGNOSIS — I10 Essential (primary) hypertension: Secondary | ICD-10-CM | POA: Diagnosis not present

## 2022-10-16 DIAGNOSIS — G459 Transient cerebral ischemic attack, unspecified: Secondary | ICD-10-CM

## 2022-10-16 DIAGNOSIS — E785 Hyperlipidemia, unspecified: Secondary | ICD-10-CM

## 2022-10-16 MED ORDER — ATORVASTATIN CALCIUM 20 MG PO TABS
20.0000 mg | ORAL_TABLET | Freq: Every day | ORAL | 3 refills | Status: DC
Start: 2022-10-16 — End: 2023-11-16

## 2022-10-16 MED ORDER — LOSARTAN POTASSIUM 50 MG PO TABS: 50.0000 mg | ORAL_TABLET | Freq: Every day | ORAL | 3 refills | Status: DC

## 2022-10-16 NOTE — Progress Notes (Signed)
Office Visit    Patient Name: Jimmy Williams Date of Encounter: 10/16/2022  PCP:  Renford Dills, MD   [facility]  Cardiologist:  Chilton Si, MD  Advanced Practice Provider:  No care team member to display Electrophysiologist:  None      Chief Complaint    Jimmy Williams is a 82 y.o. male with a hx of hypertension, rheumatoid arthritis, RA, hyperlipidemia presents today for follow-up of hypertension  Past Medical History    Past Medical History:  Diagnosis Date   Essential hypertension 08/04/2021   Hx of colonic polyps    Rheumatoid arthritis(714.0)    Past Surgical History:  Procedure Laterality Date   ROTATOR CUFF REPAIR     right    Allergies  Allergies  Allergen Reactions   Celery (Apium Graveolens Var. Dulce) Skin Test     "Finger and tongue swell"     History of Present Illness    Jimmy Williams is a 82 y.o. male with a hx of hypertension, TIA, rheumatoid arthritis, hyperlipidemia last seen 09/27/2021 by Dr. Duke Salvia.  He was seen in the ED 07/2021 with sudden onset of slurred speech with MRI revealing no acute findings but had generalized volume loss and chronic small vessel disease.  Echo with LVEF 60 to 65%, grade 1 diastolic dysfunction, negative bubble study.  30-day monitor with PVCs and no other arrhythmias.  When seen 09/27/2021 noted he had not been taking his atorvastatin regularly due to a reputation for myalgias.  He was amenable to begin.  His blood pressure had been elevated at home and diastolic blood pressure elevated in clinic and as such losartan dose was increased. At follow-up 11/14/2021 BP was at goal and he was tolerating atorvastatin without difficulty.  He presents today for follow-up.  He has been feeling overall well since last seen.  He and his wife enjoy traveling to their home in [place] where family lives.  BP at home routinely 130s over 70s-80s when checked at different times throughout the day. Reports no  shortness of breath nor dyspnea on exertion. Reports no chest pain, pressure, or tightness. No edema, orthopnea, PND. Reports no palpitations.    EKGs/Labs/Other Studies Reviewed:   The following studies were reviewed today: Monitor 09/20/21 30 Day Event Monitor   Quality: Fair.  Baseline artifact. Predominant rhythm: sinus rhythm Average heart rate: 92 bpm Max heart rate: 172 bpm Min heart rate: 62 bpm Pauses >2.5 seconds: none   Occasional PVCs No significant arrhythmias   Echo 08/05/21  1. Left ventricular ejection fraction, by estimation, is 60 to 65%. The  left ventricle has normal function. The left ventricle has no regional  wall motion abnormalities. Left ventricular diastolic parameters are  consistent with Grade I diastolic  dysfunction (impaired relaxation).   2. Right ventricular systolic function is normal. The right ventricular  size is normal. There is normal pulmonary artery systolic pressure. The  estimated right ventricular systolic pressure is 25.8 mmHg.   3. The mitral valve is normal in structure. Trivial mitral valve  regurgitation. No evidence of mitral stenosis.   4. The aortic valve is tricuspid. There is mild calcification of the  aortic valve. Aortic valve regurgitation is not visualized. Aortic valve  sclerosis is present, with no evidence of aortic valve stenosis.   5. The inferior vena cava is normal in size with greater than 50%  respiratory variability, suggesting right atrial pressure of 3 mmHg.   6. Agitated saline  contrast bubble study was negative, with no evidence  of any interatrial shunt.   Conclusion(s)/Recommendation(s): No intracardiac source of embolism detected on this transthoracic study. Consider a transesophageal echocardiogram to exclude cardiac source of embolism if clinically indicated.    MRI Brain 08/04/21 IMPRESSION: 1. No acute intracranial abnormality. 2. Generalized volume loss and findings of chronic small vessel disease    CT Head/Neck 08/04/21 IMPRESSION: CT HEAD IMPRESSION:   1. No acute intracranial abnormality. 2. Mild age-related cerebral atrophy with chronic small vessel ischemic disease.   CTA HEAD AND NECK IMPRESSION:   1. Negative CTA of the head and neck. No large vessel occlusion or other acute vascular abnormality. 2. Mild for age atheromatous change about the carotid bifurcations and carotid siphons without hemodynamically significant stenosis.   MRI Brain 07/23/21 IMPRESSION: 1. No acute intracranial abnormality. 2. Generalized volume loss and findings of chronic small vessel disease  EKG:  EKG ordered today. EKG performed today demonstrates normal sinus rhythm 82 bpm with occasional PVC and no acute ST/reviewed changes.  Recent Labs: 11/14/2021: BUN 11; Creatinine, Ser 1.02; Potassium 5.0; Sodium 144 10/11/2022: ALT 29  Recent Lipid Panel    Component Value Date/Time   CHOL 117 10/11/2022 0830   TRIG 58 10/11/2022 0830   HDL 64 10/11/2022 0830   CHOLHDL 1.8 10/11/2022 0830   CHOLHDL 2.9 08/04/2021 1947   VLDL 17 08/04/2021 1947   LDLCALC 40 10/11/2022 0830   Home Medications   Current Meds  Medication Sig   aspirin 81 MG chewable tablet Chew 1 tablet (81 mg total) by mouth daily.   B-D TB SYRINGE 1CC/27GX1/2" 27G X 1/2" 1 ML MISC    cetirizine (ZYRTEC) 10 MG tablet Take 10 mg by mouth daily.     etanercept (ENBREL SURECLICK) 50 MG/ML injection Inject 50 mg into the skin once a week.   folic acid (FOLVITE) 1 MG tablet Take 1 mg by mouth daily. Methotrexate day    Lycopene 10 MG CAPS Take 10 mg by mouth daily.   methotrexate 50 MG/2ML injection Inject 1 mL into the skin once a week.   Multiple Vitamin (MULTIVITAMIN) capsule Take 1 capsule by mouth daily.     NON FORMULARY Vitamin K2 takes daily   Probiotic Product (PROBIOTIC-10 PO) Take 1 tablet by mouth daily.   Resveratrol 100 MG CAPS Take 1 capsule by mouth daily.   SULFASALAZINE PO Take 100 mg by mouth 2 (two) times daily.    VITAMIN D, ERGOCALCIFEROL, PO Take 7,500 Units by mouth daily.   [DISCONTINUED] atorvastatin (LIPITOR) 20 MG tablet Take 1 tablet (20 mg total) by mouth daily.   [DISCONTINUED] losartan (COZAAR) 50 MG tablet Take 1 tablet (50 mg total) by mouth daily. Please keep your upcoming appointment for refills.     Review of Systems      All other systems reviewed and are otherwise negative except as noted above.  Physical Exam    VS:  BP 130/78   Pulse 82   Ht  (1.702 m)   Wt 192 lb (87.1 kg)   BMI 30.07 kg/m  , BMI Body mass index is 30.07 kg/m.  Wt Readings from Last 3 Encounters:  10/16/22 192 lb (87.1 kg)  11/14/21 186 lb (84.4 kg)  09/28/21 184 lb (83.5 kg)    GEN: Well nourished, well developed, in no acute distress. HEENT: normal. Neck: Supple, no JVD, carotid bruits, or masses. Cardiac: RRR, no murmurs, rubs, or gallops. No clubbing, cyanosis, edema.  Radials/PT 2+ and equal bilaterally.  Respiratory:  Respirations regular and unlabored, clear to auscultation bilaterally. GI: Soft, nontender, nondistended. MS: No deformity or atrophy. Skin: Warm and dry, no rash. Neuro:  Strength and sensation are intact. Psych: Normal affect.  Assessment & Plan    HTN - BP well controlled. Continue current antihypertensive regimen Losartan 50 mg daily.  Refills provided.  Heart healthy diet and regular cardiovascular exercise encouraged.  If BP in the future elevated above 130/80 could consider further increasing dose.  TIA -prior imaging revealed microvascular disease.  Continue aspirin, statin.  HLD, LDL goal <70 -tolerating atorvastatin without myalgias.  10/11/2022 LDL 40.  Continue atorvastatin 20 mg daily.  Refills provided.     Disposition: Follow up in 1 year(s) with Chilton Si, MD or APP.  Signed, Alver Sorrow, NP 10/16/2022, 1:02 PM [facility]

## 2022-10-16 NOTE — Patient Instructions (Addendum)
Medication Instructions:  Your physician recommends that you continue on your current medications as directed. Please refer to the Current Medication list given to you today.   Follow-Up: At [facility], you and your health needs are our priority.  As part of our continuing mission to provide you with exceptional heart care, we have created designated Provider Care Teams.  These Care Teams include your primary Cardiologist (physician) and Advanced Practice Providers (APPs -  Physician Assistants and Nurse Practitioners) who all work together to provide you with the care you need, when you need it.  We recommend signing up for the patient portal called "MyChart".  Sign up information is provided on this After Visit Summary.  MyChart is used to connect with patients for Virtual Visits (Telemedicine).  Patients are able to view lab/test results, encounter notes, upcoming appointments, etc.  Non-urgent messages can be sent to your provider as well.   To learn more about what you can do with MyChart, go to [email or web address].    Your next appointment:   1 year(s)  Provider:   Chilton Si, MD    Other Instructions [facility] [address] [place],  [place]  [address] Main: [phone]  [facility] [address] [place],  [place]  [address] Main: [phone]

## 2022-10-18 DIAGNOSIS — Z79899 Other long term (current) drug therapy: Secondary | ICD-10-CM | POA: Diagnosis not present

## 2022-10-18 DIAGNOSIS — D696 Thrombocytopenia, unspecified: Secondary | ICD-10-CM | POA: Diagnosis not present

## 2022-10-18 DIAGNOSIS — Z6832 Body mass index (BMI) 32.0-32.9, adult: Secondary | ICD-10-CM | POA: Diagnosis not present

## 2022-10-18 DIAGNOSIS — E669 Obesity, unspecified: Secondary | ICD-10-CM | POA: Diagnosis not present

## 2022-10-18 DIAGNOSIS — R5383 Other fatigue: Secondary | ICD-10-CM | POA: Diagnosis not present

## 2022-10-18 DIAGNOSIS — M1991 Primary osteoarthritis, unspecified site: Secondary | ICD-10-CM | POA: Diagnosis not present

## 2022-10-18 DIAGNOSIS — M0589 Other rheumatoid arthritis with rheumatoid factor of multiple sites: Secondary | ICD-10-CM | POA: Diagnosis not present

## 2022-11-27 ENCOUNTER — Ambulatory Visit (HOSPITAL_BASED_OUTPATIENT_CLINIC_OR_DEPARTMENT_OTHER): Payer: Medicare HMO | Admitting: Family

## 2022-12-12 DIAGNOSIS — D492 Neoplasm of unspecified behavior of bone, soft tissue, and skin: Secondary | ICD-10-CM | POA: Diagnosis not present

## 2022-12-12 DIAGNOSIS — L821 Other seborrheic keratosis: Secondary | ICD-10-CM | POA: Diagnosis not present

## 2023-01-03 ENCOUNTER — Other Ambulatory Visit (HOSPITAL_BASED_OUTPATIENT_CLINIC_OR_DEPARTMENT_OTHER): Payer: Self-pay | Admitting: Cardiovascular Disease

## 2023-01-03 DIAGNOSIS — I10 Essential (primary) hypertension: Secondary | ICD-10-CM

## 2023-01-06 DIAGNOSIS — E785 Hyperlipidemia, unspecified: Secondary | ICD-10-CM | POA: Diagnosis not present

## 2023-01-06 DIAGNOSIS — R29700 NIHSS score 0: Secondary | ICD-10-CM | POA: Diagnosis not present

## 2023-01-06 DIAGNOSIS — R29818 Other symptoms and signs involving the nervous system: Secondary | ICD-10-CM | POA: Diagnosis not present

## 2023-01-06 DIAGNOSIS — G459 Transient cerebral ischemic attack, unspecified: Secondary | ICD-10-CM | POA: Diagnosis not present

## 2023-01-06 DIAGNOSIS — H538 Other visual disturbances: Secondary | ICD-10-CM | POA: Diagnosis not present

## 2023-01-06 DIAGNOSIS — R4701 Aphasia: Secondary | ICD-10-CM | POA: Diagnosis not present

## 2023-01-06 DIAGNOSIS — I10 Essential (primary) hypertension: Secondary | ICD-10-CM | POA: Diagnosis not present

## 2023-01-06 DIAGNOSIS — R9431 Abnormal electrocardiogram [ECG] [EKG]: Secondary | ICD-10-CM | POA: Diagnosis not present

## 2023-01-06 DIAGNOSIS — H53129 Transient visual loss, unspecified eye: Secondary | ICD-10-CM | POA: Diagnosis not present

## 2023-01-06 DIAGNOSIS — Z87891 Personal history of nicotine dependence: Secondary | ICD-10-CM | POA: Diagnosis not present

## 2023-01-12 DIAGNOSIS — Z8673 Personal history of transient ischemic attack (TIA), and cerebral infarction without residual deficits: Secondary | ICD-10-CM | POA: Diagnosis not present

## 2023-01-12 DIAGNOSIS — E785 Hyperlipidemia, unspecified: Secondary | ICD-10-CM | POA: Diagnosis not present

## 2023-01-12 DIAGNOSIS — M0579 Rheumatoid arthritis with rheumatoid factor of multiple sites without organ or systems involvement: Secondary | ICD-10-CM | POA: Diagnosis not present

## 2023-01-12 DIAGNOSIS — D7589 Other specified diseases of blood and blood-forming organs: Secondary | ICD-10-CM | POA: Diagnosis not present

## 2023-01-20 DIAGNOSIS — R059 Cough, unspecified: Secondary | ICD-10-CM | POA: Diagnosis not present

## 2023-01-20 DIAGNOSIS — I491 Atrial premature depolarization: Secondary | ICD-10-CM | POA: Diagnosis not present

## 2023-01-20 DIAGNOSIS — M069 Rheumatoid arthritis, unspecified: Secondary | ICD-10-CM | POA: Diagnosis not present

## 2023-01-20 DIAGNOSIS — U071 COVID-19: Secondary | ICD-10-CM | POA: Diagnosis not present

## 2023-01-20 DIAGNOSIS — R0981 Nasal congestion: Secondary | ICD-10-CM | POA: Diagnosis not present

## 2023-01-20 DIAGNOSIS — R000 Tachycardia, unspecified: Secondary | ICD-10-CM | POA: Diagnosis not present

## 2023-01-20 DIAGNOSIS — Z87891 Personal history of nicotine dependence: Secondary | ICD-10-CM | POA: Diagnosis not present

## 2023-01-20 DIAGNOSIS — R509 Fever, unspecified: Secondary | ICD-10-CM | POA: Diagnosis not present

## 2023-01-20 DIAGNOSIS — R079 Chest pain, unspecified: Secondary | ICD-10-CM | POA: Diagnosis not present

## 2023-01-20 DIAGNOSIS — Z7982 Long term (current) use of aspirin: Secondary | ICD-10-CM | POA: Diagnosis not present

## 2023-01-20 DIAGNOSIS — Z7960 Long term (current) use of unspecified immunomodulators and immunosuppressants: Secondary | ICD-10-CM | POA: Diagnosis not present

## 2023-01-24 ENCOUNTER — Telehealth: Payer: Self-pay | Admitting: Family

## 2023-01-24 ENCOUNTER — Encounter (HOSPITAL_BASED_OUTPATIENT_CLINIC_OR_DEPARTMENT_OTHER): Payer: Self-pay | Admitting: Cardiovascular Disease

## 2023-01-24 NOTE — Telephone Encounter (Signed)
Pt called in asking to speak with provider about his recent cholesterol levels by Verlee Rossetti, NP (Results are in Care Everywhere

## 2023-01-24 NOTE — Telephone Encounter (Signed)
Responded to [employer] patient sent, he has viewed

## 2023-02-08 DIAGNOSIS — R4701 Aphasia: Secondary | ICD-10-CM | POA: Diagnosis not present

## 2023-02-08 DIAGNOSIS — I6522 Occlusion and stenosis of left carotid artery: Secondary | ICD-10-CM | POA: Diagnosis not present

## 2023-02-08 DIAGNOSIS — Z8673 Personal history of transient ischemic attack (TIA), and cerebral infarction without residual deficits: Secondary | ICD-10-CM | POA: Diagnosis not present

## 2023-02-08 DIAGNOSIS — R4781 Slurred speech: Secondary | ICD-10-CM | POA: Diagnosis not present

## 2023-02-08 DIAGNOSIS — Z7982 Long term (current) use of aspirin: Secondary | ICD-10-CM | POA: Diagnosis not present

## 2023-02-08 DIAGNOSIS — E785 Hyperlipidemia, unspecified: Secondary | ICD-10-CM | POA: Diagnosis not present

## 2023-02-08 DIAGNOSIS — Z87891 Personal history of nicotine dependence: Secondary | ICD-10-CM | POA: Diagnosis not present

## 2023-02-08 DIAGNOSIS — R29700 NIHSS score 0: Secondary | ICD-10-CM | POA: Diagnosis not present

## 2023-02-08 DIAGNOSIS — M069 Rheumatoid arthritis, unspecified: Secondary | ICD-10-CM | POA: Diagnosis not present

## 2023-02-08 DIAGNOSIS — G459 Transient cerebral ischemic attack, unspecified: Secondary | ICD-10-CM | POA: Diagnosis not present

## 2023-02-08 DIAGNOSIS — I10 Essential (primary) hypertension: Secondary | ICD-10-CM | POA: Diagnosis not present

## 2023-02-15 DIAGNOSIS — R799 Abnormal finding of blood chemistry, unspecified: Secondary | ICD-10-CM | POA: Diagnosis not present

## 2023-02-15 DIAGNOSIS — Z8673 Personal history of transient ischemic attack (TIA), and cerebral infarction without residual deficits: Secondary | ICD-10-CM | POA: Diagnosis not present

## 2023-02-15 DIAGNOSIS — G459 Transient cerebral ischemic attack, unspecified: Secondary | ICD-10-CM | POA: Diagnosis not present

## 2023-03-13 DIAGNOSIS — Z8673 Personal history of transient ischemic attack (TIA), and cerebral infarction without residual deficits: Secondary | ICD-10-CM | POA: Diagnosis not present

## 2023-03-21 DIAGNOSIS — M0589 Other rheumatoid arthritis with rheumatoid factor of multiple sites: Secondary | ICD-10-CM | POA: Diagnosis not present

## 2023-04-03 DIAGNOSIS — Z8673 Personal history of transient ischemic attack (TIA), and cerebral infarction without residual deficits: Secondary | ICD-10-CM | POA: Diagnosis not present

## 2023-04-04 DIAGNOSIS — I471 Supraventricular tachycardia, unspecified: Secondary | ICD-10-CM | POA: Diagnosis not present

## 2023-04-04 DIAGNOSIS — I491 Atrial premature depolarization: Secondary | ICD-10-CM | POA: Diagnosis not present

## 2023-04-12 ENCOUNTER — Other Ambulatory Visit: Payer: Self-pay | Admitting: Family

## 2023-04-13 ENCOUNTER — Telehealth (HOSPITAL_BASED_OUTPATIENT_CLINIC_OR_DEPARTMENT_OTHER): Payer: Self-pay | Admitting: Cardiovascular Disease

## 2023-04-13 MED ORDER — CLOPIDOGREL BISULFATE 75 MG PO TABS: 75.0000 mg | ORAL_TABLET | Freq: Every day | ORAL | 3 refills | Status: AC

## 2023-04-13 NOTE — Telephone Encounter (Signed)
Pt c/o medication issue:  1. Name of Medication: clopidogrel (PLAVIX) 75 MG tablet   2. How are you currently taking this medication (dosage and times per day)? N/A  3. Are you having a reaction (difficulty breathing--STAT)? N/A  4. What is your medication issue? Medication was sent to the wrong pharmacy. Should of went to [facility] in [place].   Patient also states via pt schedule: "I have had several TIA's while in [place] (the non indicated results are in the MyChart Portal), the neurogist set me up for a monitor but a cardiologist had me with a Holder monitor for two weeks. I have the results but they have not been interpreted. I am in [place] for a while currently."  "On review does she want to see me?"  Please advise.

## 2023-04-13 NOTE — Telephone Encounter (Signed)
Returned call to patient, provider recommendations reviewed. Patient not having any symptoms and will be seen as recommended.

## 2023-04-13 NOTE — Telephone Encounter (Signed)
Seen 09/2022 recommended for annual f/u. Neurology recommended aspirin 81mg  daily & plavix 75mg  daily indefinitely due to risk - okay to refill Plavix 75mg  daily to avoid interruption in therapy.   Monitor report with predominantly NSR. 2 brief runs of fast heart rate chamber in bottom of heart (VT) and 138 brief runs of fast heart rate in the top chamber of the heart (SVT).  If he is not experiencing any palpitations would not recommend a sooner follow-up. If he is symptoms he is concerned about can certainly schedule for sooner follow-up.  Alver Sorrow, NP   ________________________________________  Monitor report from Care everywhere: Predominant rhythm: Normal sinus rhythm.  Sinus rhythm heart rate range 55-138 bpm (average 93 bpm). Occasional (1.3%) premature atrial contractions. There were 138 brief runs of nonsustained supraventricular tachycardia with the longest lasting 20.4 seconds. Rare premature ventricular contractions.  There were 2 brief runs of nonsustained ventricular tachycardia with the longest lasting 4 beats. No significant pauses. There was 1 patient triggered event. This was not associated with any arrhythmia (occurred during sinus rhythm).  Note: Short runs (less than 30 seconds) of asymptomatic nonsustained supraventricular tachycardia are likely benign and in the absence of other concerning clinical indicators are not a primary reason for further evaluation.

## 2023-04-13 NOTE — Telephone Encounter (Signed)
Please review recent TIA notes and monitor, does patient need sooner follow up, okay to send refill?

## 2023-04-19 DIAGNOSIS — E669 Obesity, unspecified: Secondary | ICD-10-CM | POA: Diagnosis not present

## 2023-04-19 DIAGNOSIS — M1991 Primary osteoarthritis, unspecified site: Secondary | ICD-10-CM | POA: Diagnosis not present

## 2023-04-19 DIAGNOSIS — Z79899 Other long term (current) drug therapy: Secondary | ICD-10-CM | POA: Diagnosis not present

## 2023-04-19 DIAGNOSIS — M0589 Other rheumatoid arthritis with rheumatoid factor of multiple sites: Secondary | ICD-10-CM | POA: Diagnosis not present

## 2023-04-19 DIAGNOSIS — Z6832 Body mass index (BMI) 32.0-32.9, adult: Secondary | ICD-10-CM | POA: Diagnosis not present

## 2023-05-29 DIAGNOSIS — G459 Transient cerebral ischemic attack, unspecified: Secondary | ICD-10-CM | POA: Diagnosis not present

## 2023-05-29 DIAGNOSIS — I10 Essential (primary) hypertension: Secondary | ICD-10-CM | POA: Diagnosis not present

## 2023-05-29 DIAGNOSIS — E7800 Pure hypercholesterolemia, unspecified: Secondary | ICD-10-CM | POA: Diagnosis not present

## 2023-07-03 DIAGNOSIS — G459 Transient cerebral ischemic attack, unspecified: Secondary | ICD-10-CM | POA: Diagnosis not present

## 2023-07-20 DIAGNOSIS — M0589 Other rheumatoid arthritis with rheumatoid factor of multiple sites: Secondary | ICD-10-CM | POA: Diagnosis not present

## 2023-07-30 DIAGNOSIS — H43813 Vitreous degeneration, bilateral: Secondary | ICD-10-CM | POA: Diagnosis not present

## 2023-07-30 DIAGNOSIS — H01005 Unspecified blepharitis left lower eyelid: Secondary | ICD-10-CM | POA: Diagnosis not present

## 2023-07-30 DIAGNOSIS — H01002 Unspecified blepharitis right lower eyelid: Secondary | ICD-10-CM | POA: Diagnosis not present

## 2023-07-30 DIAGNOSIS — H01004 Unspecified blepharitis left upper eyelid: Secondary | ICD-10-CM | POA: Diagnosis not present

## 2023-07-30 DIAGNOSIS — H5213 Myopia, bilateral: Secondary | ICD-10-CM | POA: Diagnosis not present

## 2023-07-30 DIAGNOSIS — H01001 Unspecified blepharitis right upper eyelid: Secondary | ICD-10-CM | POA: Diagnosis not present

## 2023-07-30 DIAGNOSIS — H2513 Age-related nuclear cataract, bilateral: Secondary | ICD-10-CM | POA: Diagnosis not present

## 2023-09-10 DIAGNOSIS — L821 Other seborrheic keratosis: Secondary | ICD-10-CM | POA: Diagnosis not present

## 2023-09-10 DIAGNOSIS — Z85828 Personal history of other malignant neoplasm of skin: Secondary | ICD-10-CM | POA: Diagnosis not present

## 2023-09-10 DIAGNOSIS — Z08 Encounter for follow-up examination after completed treatment for malignant neoplasm: Secondary | ICD-10-CM | POA: Diagnosis not present

## 2023-09-10 DIAGNOSIS — L820 Inflamed seborrheic keratosis: Secondary | ICD-10-CM | POA: Diagnosis not present

## 2023-09-10 DIAGNOSIS — L570 Actinic keratosis: Secondary | ICD-10-CM | POA: Diagnosis not present

## 2023-09-10 DIAGNOSIS — Z85820 Personal history of malignant melanoma of skin: Secondary | ICD-10-CM | POA: Diagnosis not present

## 2023-09-10 DIAGNOSIS — D225 Melanocytic nevi of trunk: Secondary | ICD-10-CM | POA: Diagnosis not present

## 2023-09-10 DIAGNOSIS — D492 Neoplasm of unspecified behavior of bone, soft tissue, and skin: Secondary | ICD-10-CM | POA: Diagnosis not present

## 2023-09-10 DIAGNOSIS — L814 Other melanin hyperpigmentation: Secondary | ICD-10-CM | POA: Diagnosis not present

## 2023-09-17 DIAGNOSIS — H524 Presbyopia: Secondary | ICD-10-CM | POA: Diagnosis not present

## 2023-09-17 DIAGNOSIS — H02052 Trichiasis without entropian right lower eyelid: Secondary | ICD-10-CM | POA: Diagnosis not present

## 2023-09-17 DIAGNOSIS — H2513 Age-related nuclear cataract, bilateral: Secondary | ICD-10-CM | POA: Diagnosis not present

## 2023-09-17 DIAGNOSIS — H02055 Trichiasis without entropian left lower eyelid: Secondary | ICD-10-CM | POA: Diagnosis not present

## 2023-10-24 DIAGNOSIS — Z79899 Other long term (current) drug therapy: Secondary | ICD-10-CM | POA: Diagnosis not present

## 2023-10-24 DIAGNOSIS — M0589 Other rheumatoid arthritis with rheumatoid factor of multiple sites: Secondary | ICD-10-CM | POA: Diagnosis not present

## 2023-10-24 DIAGNOSIS — Z6833 Body mass index (BMI) 33.0-33.9, adult: Secondary | ICD-10-CM | POA: Diagnosis not present

## 2023-10-24 DIAGNOSIS — M1991 Primary osteoarthritis, unspecified site: Secondary | ICD-10-CM | POA: Diagnosis not present

## 2023-10-24 DIAGNOSIS — R5383 Other fatigue: Secondary | ICD-10-CM | POA: Diagnosis not present

## 2023-10-24 DIAGNOSIS — E669 Obesity, unspecified: Secondary | ICD-10-CM | POA: Diagnosis not present

## 2023-11-16 ENCOUNTER — Other Ambulatory Visit: Payer: Self-pay | Admitting: Family

## 2023-11-16 DIAGNOSIS — E785 Hyperlipidemia, unspecified: Secondary | ICD-10-CM

## 2023-12-12 ENCOUNTER — Other Ambulatory Visit: Payer: Self-pay | Admitting: Family

## 2023-12-12 DIAGNOSIS — E785 Hyperlipidemia, unspecified: Secondary | ICD-10-CM

## 2024-01-07 ENCOUNTER — Encounter (HOSPITAL_BASED_OUTPATIENT_CLINIC_OR_DEPARTMENT_OTHER): Payer: Self-pay | Admitting: Cardiovascular Disease

## 2024-01-07 ENCOUNTER — Other Ambulatory Visit: Payer: Self-pay | Admitting: Family

## 2024-01-07 DIAGNOSIS — E785 Hyperlipidemia, unspecified: Secondary | ICD-10-CM

## 2024-01-07 NOTE — Telephone Encounter (Signed)
 RX requested Too Soon.

## 2024-01-08 ENCOUNTER — Other Ambulatory Visit: Payer: Self-pay | Admitting: Family

## 2024-01-08 DIAGNOSIS — I10 Essential (primary) hypertension: Secondary | ICD-10-CM

## 2024-01-14 ENCOUNTER — Telehealth: Payer: Self-pay | Admitting: Cardiovascular Disease

## 2024-01-14 DIAGNOSIS — E7800 Pure hypercholesterolemia, unspecified: Secondary | ICD-10-CM

## 2024-01-14 DIAGNOSIS — E785 Hyperlipidemia, unspecified: Secondary | ICD-10-CM

## 2024-01-14 DIAGNOSIS — Z5181 Encounter for therapeutic drug level monitoring: Secondary | ICD-10-CM

## 2024-01-14 DIAGNOSIS — I10 Essential (primary) hypertension: Secondary | ICD-10-CM

## 2024-01-14 MED ORDER — ATORVASTATIN CALCIUM 20 MG PO TABS: 20.0000 mg | ORAL_TABLET | Freq: Every day | ORAL | 0 refills | Status: DC

## 2024-01-14 MED ORDER — LOSARTAN POTASSIUM 50 MG PO TABS: 50.0000 mg | ORAL_TABLET | Freq: Every day | ORAL | 0 refills | Status: DC

## 2024-01-14 NOTE — Telephone Encounter (Signed)
*  STAT* If patient is at the pharmacy, call can be transferred to refill team.   1. Which medications need to be refilled? (please list name of each medication and dose if known)  atorvastatin  (LIPITOR) 20 MG tablet   losartan  (COZAAR ) 50 MG tablet   2. Which pharmacy/location (including street and city if local pharmacy) is medication to be sent to? [id number] - [place], [place] - [address] Phone: [phone]  Fax: [phone]     3. Do they need a 30 day or 90 day supply? Pt has made appt for 04/08/24 please refill til then

## 2024-01-14 NOTE — Telephone Encounter (Signed)
 Pt wants to know if we need labs before his appt if so can we put the orders in and let him know.

## 2024-01-14 NOTE — Telephone Encounter (Signed)
 Agree with plan for LP/CMET. TY!  Ladarren Steiner S Rhianon Zabawa, NP

## 2024-01-14 NOTE — Telephone Encounter (Signed)
 Spoke with patient regarding labs.  Has not had cholesterol checked in the last year that he is aware of Ordered fasting LP/CMET and mailed lab orders.  Did explain to patient that there is the possibility additional lab may need to be done at time of visit. He is ok with that and would like to have cholesterol prior to visit so can be discussed at visit

## 2024-01-14 NOTE — Telephone Encounter (Signed)
 RX sent to requested Pharmacy

## 2024-01-23 DIAGNOSIS — M0589 Other rheumatoid arthritis with rheumatoid factor of multiple sites: Secondary | ICD-10-CM | POA: Diagnosis not present

## 2024-01-24 LAB — LAB REPORT - SCANNED: EGFR: 86

## 2024-02-05 ENCOUNTER — Encounter (HOSPITAL_BASED_OUTPATIENT_CLINIC_OR_DEPARTMENT_OTHER): Payer: Self-pay | Admitting: Family

## 2024-02-05 ENCOUNTER — Ambulatory Visit (HOSPITAL_BASED_OUTPATIENT_CLINIC_OR_DEPARTMENT_OTHER): Admitting: Family

## 2024-02-05 ENCOUNTER — Encounter (HOSPITAL_BASED_OUTPATIENT_CLINIC_OR_DEPARTMENT_OTHER): Payer: Self-pay

## 2024-02-05 VITALS — BP 140/90 | HR 97 | Ht 67.0 in | Wt 208.0 lb

## 2024-02-05 DIAGNOSIS — Z8673 Personal history of transient ischemic attack (TIA), and cerebral infarction without residual deficits: Secondary | ICD-10-CM

## 2024-02-05 DIAGNOSIS — I10 Essential (primary) hypertension: Secondary | ICD-10-CM

## 2024-02-05 DIAGNOSIS — E785 Hyperlipidemia, unspecified: Secondary | ICD-10-CM | POA: Diagnosis not present

## 2024-02-05 DIAGNOSIS — E7800 Pure hypercholesterolemia, unspecified: Secondary | ICD-10-CM | POA: Diagnosis not present

## 2024-02-05 DIAGNOSIS — Z5181 Encounter for therapeutic drug level monitoring: Secondary | ICD-10-CM | POA: Diagnosis not present

## 2024-02-05 LAB — LIPID PANEL
Chol/HDL Ratio: 2.1 ratio (ref 0.0–5.0)
Cholesterol, Total: 128 mg/dL (ref 100–199)
HDL: 62 mg/dL (ref >39)
LDL Chol Calc (NIH): 54 mg/dL (ref 0–99)
Triglycerides: 52 mg/dL (ref 0–149)
VLDL Cholesterol Cal: 12 mg/dL (ref 5–40)

## 2024-02-05 LAB — COMPREHENSIVE METABOLIC PANEL WITH GFR
ALT: 20 IU/L (ref 0–44)
AST: 27 IU/L (ref 0–40)
Albumin: 4 g/dL (ref 3.7–4.7)
Alkaline Phosphatase: 66 IU/L (ref 44–121)
BUN/Creatinine Ratio: 18 (ref 10–24)
BUN: 18 mg/dL (ref 8–27)
Bilirubin Total: 0.4 mg/dL (ref 0.0–1.2)
CO2: 22 mmol/L (ref 20–29)
Calcium: 8.8 mg/dL (ref 8.6–10.2)
Chloride: 104 mmol/L (ref 96–106)
Creatinine, Ser: 0.98 mg/dL (ref 0.76–1.27)
Globulin, Total: 2.6 g/dL (ref 1.5–4.5)
Glucose: 97 mg/dL (ref 70–99)
Potassium: 4.3 mmol/L (ref 3.5–5.2)
Sodium: 139 mmol/L (ref 134–144)
Total Protein: 6.6 g/dL (ref 6.0–8.5)
eGFR: 77 mL/min/1.73 (ref >59)

## 2024-02-05 MED ORDER — LOSARTAN POTASSIUM 25 MG PO TABS: 25.0000 mg | ORAL_TABLET | Freq: Every day | ORAL | 0 refills | Status: DC

## 2024-02-05 NOTE — Patient Instructions (Addendum)
 Medication Instructions:  CHANGE Losartan  to 25mg  daily *If you need a refill on your cardiac medications before your next appointment, please call your pharmacy*  Follow-Up: At [facility], you and your health needs are our priority.  As part of our continuing mission to provide you with exceptional heart care, our providers are all part of one team.  This team includes your primary Cardiologist (physician) and Advanced Practice Providers or APPs (Physician Assistants and Nurse Practitioners) who all work together to provide you with the care you need, when you need it.  Your next appointment:   1 year(s)  Provider:   Annabella Scarce, MD or Reche Finder, NP    We recommend signing up for the patient portal called MyChart.  Sign up information is provided on this After Visit Summary.  MyChart is used to connect with patients for Virtual Visits (Telemedicine).  Patients are able to view lab/test results, encounter notes, upcoming appointments, etc.  Non-urgent messages can be sent to your provider as well.   To learn more about what you can do with MyChart, go to [email or web address].   Other Instructions  We will send a MyChart message in 2 weeks to check in on blood pressure after medication changes.  If your blood pressure is consistently more than 130/80, we will plan to increase the Losartan  back to 50 mg dose. If BP at goal less than 130/80, we will continue with the lower 25 mg dose.      Exercise recommendations: The American Heart Association recommends 150 minutes of moderate intensity exercise weekly. Try 30 minutes of moderate intensity exercise 4-5 times per week. This could include walking, jogging, or swimming.  Heart Healthy Diet Recommendations: A low-salt diet is recommended. Meats should be grilled, baked, or boiled. Avoid fried foods. Focus on lean protein sources like fish or chicken with vegetables and fruits. The American Heart Association is a  Chief Technology Officer!  American Heart Association Diet and Lifeystyle Recommendations

## 2024-02-05 NOTE — Progress Notes (Signed)
 Cardiology Office Note   Date:  02/05/2024  ID:  JOHNEY Williams, DOB 1941/05/02, MRN [id number] PCP: Rexanne Ingle, MD  [facility] Providers Cardiologist:  Annabella Scarce, MD     History of Present Illness Jimmy Williams is a 83 y.o. male with hx of hypertension, TIA, rheumatoid arthritis, hyperlipidemia.  He was seen in the ED 07/2021 with sudden onset of slurred speech with MRI revealing no acute findings but had generalized volume loss and chronic small vessel disease.  Echo with LVEF 60 to 65%, grade 1 diastolic dysfunction, negative bubble study.  30-day monitor with PVCs and no other arrhythmias.  When seen 09/27/2021 noted he had not been taking his atorvastatin  regularly due to a reputation for myalgias.  He was amenable to begin.  His blood pressure had been elevated at home and diastolic blood pressure elevated in clinic and as such losartan  dose was increased. At follow-up 11/14/2021 and 10/2022 BP was at goal and he was tolerating atorvastatin  without difficulty.  He presents today for follow-up.  Pleasant gentleman who splits his time between [place]  and [place]  where family lives.  BP at home routinely 120s/70-80s. Reports no shortness of breath nor dyspnea on exertion. Reports no chest pain, pressure, or tightness. No edema, orthopnea, PND. Reports no palpitations.  Interested in reducing medication regimen if possible.   ROS: Please see the history of present illness.    All other systems reviewed and are negative.   Studies Reviewed EKG Interpretation Date/Time:  Tuesday February 05 2024 15:41:30 EDT Ventricular Rate:  97 PR Interval:  184 QRS Duration:  70 QT Interval:  340 QTC Calculation: 431 R Axis:   -14  Text Interpretation: Normal sinus rhythm Normal ECG Confirmed by Vannie Mora ([id number]) on 02/05/2024 3:50:41 PM    Cardiac Studies & Procedures   ______________________________________________________________________________________________      ECHOCARDIOGRAM  ECHOCARDIOGRAM COMPLETE BUBBLE STUDY 08/05/2021  Narrative ECHOCARDIOGRAM REPORT    Patient Name:   Jimmy Williams Date of Exam: 08/05/2021 Medical Rec #:  [id number]       Height:       67.0 in Accession #:    [id number]      Weight:       184.0 lb Date of Birth:  [birth year]       BSA:          1.952 m Patient Age:    80 years        BP:           130/87 mmHg Patient Gender: M               HR:           88 bpm. Exam Location:  Inpatient  Procedure: 2D Echo, Cardiac Doppler, Color Doppler and Saline Contrast Bubble Study  Indications:    Stroke  History:        Patient has no prior history of Echocardiogram examinations. Risk Factors:Hypertension.  Sonographer:    Waddell Captain Referring Phys: [id number] ZACHARY PARAS SHALHOUB  IMPRESSIONS   1. Left ventricular ejection fraction, by estimation, is 60 to 65%. The left ventricle has normal function. The left ventricle has no regional wall motion abnormalities. Left ventricular diastolic parameters are consistent with Grade I diastolic dysfunction (impaired relaxation). 2. Right ventricular systolic function is normal. The right ventricular size is normal. There is normal pulmonary artery systolic pressure. The estimated right ventricular systolic pressure is 25.8 mmHg. 3. The mitral valve is normal in  structure. Trivial mitral valve regurgitation. No evidence of mitral stenosis. 4. The aortic valve is tricuspid. There is mild calcification of the aortic valve. Aortic valve regurgitation is not visualized. Aortic valve sclerosis is present, with no evidence of aortic valve stenosis. 5. The inferior vena cava is normal in size with greater than 50% respiratory variability, suggesting right atrial pressure of 3 mmHg. 6. Agitated saline contrast bubble study was negative, with no evidence of any interatrial shunt.  Conclusion(s)/Recommendation(s): No intracardiac source of embolism detected on this transthoracic study.  Consider a transesophageal echocardiogram to exclude cardiac source of embolism if clinically indicated.  FINDINGS Left Ventricle: Left ventricular ejection fraction, by estimation, is 60 to 65%. The left ventricle has normal function. The left ventricle has no regional wall motion abnormalities. The left ventricular internal cavity size was normal in size. There is no left ventricular hypertrophy. Left ventricular diastolic parameters are consistent with Grade I diastolic dysfunction (impaired relaxation).  Right Ventricle: The right ventricular size is normal. No increase in right ventricular wall thickness. Right ventricular systolic function is normal. There is normal pulmonary artery systolic pressure. The tricuspid regurgitant velocity is 2.39 m/s, and with an assumed right atrial pressure of 3 mmHg, the estimated right ventricular systolic pressure is 25.8 mmHg.  Left Atrium: Left atrial size was normal in size.  Right Atrium: Right atrial size was normal in size. Prominent Eustachian valve.  Pericardium: There is no evidence of pericardial effusion.  Mitral Valve: The mitral valve is normal in structure. Trivial mitral valve regurgitation. No evidence of mitral valve stenosis.  Tricuspid Valve: The tricuspid valve is normal in structure. Tricuspid valve regurgitation is trivial. No evidence of tricuspid stenosis.  Aortic Valve: The aortic valve is tricuspid. There is mild calcification of the aortic valve. Aortic valve regurgitation is not visualized. Aortic valve sclerosis is present, with no evidence of aortic valve stenosis. Aortic valve peak gradient measures 5.6 mmHg.  Pulmonic Valve: The pulmonic valve was normal in structure. Pulmonic valve regurgitation is not visualized. No evidence of pulmonic stenosis.  Aorta: The aortic root is normal in size and structure.  Venous: The inferior vena cava is normal in size with greater than 50% respiratory variability, suggesting right  atrial pressure of 3 mmHg.  IAS/Shunts: No atrial level shunt detected by color flow Doppler. Agitated saline contrast was given intravenously to evaluate for intracardiac shunting. Agitated saline contrast bubble study was negative, with no evidence of any interatrial shunt.   LEFT VENTRICLE PLAX 2D LVIDd:         4.20 cm      Diastology LVIDs:         2.80 cm      LV e' medial:    5.33 cm/s LV PW:         0.80 cm      LV E/e' medial:  12.8 LV IVS:        1.10 cm      LV e' lateral:   7.40 cm/s LVOT diam:     2.10 cm      LV E/e' lateral: 9.2 LV SV:         70 LV SV Index:   36 LVOT Area:     3.46 cm  LV Volumes (MOD) LV vol d, MOD A2C: 114.0 ml LV vol d, MOD A4C: 84.0 ml LV vol s, MOD A2C: 45.7 ml LV vol s, MOD A4C: 36.5 ml LV SV MOD A2C:     68.3 ml LV  SV MOD A4C:     84.0 ml LV SV MOD BP:      59.6 ml  RIGHT VENTRICLE            IVC RV Basal diam:  3.30 cm    IVC diam: 1.30 cm RV Mid diam:    2.50 cm RV S prime:     9.79 cm/s TAPSE (M-mode): 2.2 cm  LEFT ATRIUM             Index        RIGHT ATRIUM           Index LA diam:        3.70 cm 1.90 cm/m   RA Area:     11.20 cm LA Vol (A2C):   51.6 ml 26.44 ml/m  RA Volume:   23.70 ml  12.14 ml/m LA Vol (A4C):   31.9 ml 16.34 ml/m LA Biplane Vol: 42.1 ml 21.57 ml/m AORTIC VALVE AV Area (Vmax): 2.91 cm AV Vmax:        118.00 cm/s AV Peak Grad:   5.6 mmHg LVOT Vmax:      99.20 cm/s LVOT Vmean:     73.500 cm/s LVOT VTI:       0.201 m  AORTA Ao Root diam: 3.60 cm Ao Asc diam:  2.90 cm  MITRAL VALVE               TRICUSPID VALVE MV Area (PHT): 3.48 cm    TR Peak grad:   22.8 mmHg MV Decel Time: 218 msec    TR Vmax:        239.00 cm/s MR Peak grad: 47.6 mmHg MR Vmax:      345.00 cm/s  SHUNTS MV E velocity: 68.10 cm/s  Systemic VTI:  0.20 m MV A velocity: 96.00 cm/s  Systemic Diam: 2.10 cm MV E/A ratio:  0.71  Mihai Croitoru MD Electronically signed by Jerel Balding MD Signature Date/Time: 08/05/2021/2:17:50  PM    Final    MONITORS  CARDIAC EVENT MONITOR 09/20/2021  Narrative 30 Day Event Monitor  Quality: Fair.  Baseline artifact. Predominant rhythm: sinus rhythm Average heart rate: 92 bpm Max heart rate: 172 bpm Min heart rate: 62 bpm Pauses >2.5 seconds: none  Occasional PVCs No significant arrhythmias  Tiffany C. Raford, MD, [facility] 09/27/2021 8:29 AM       ______________________________________________________________________________________________      Risk Assessment/Calculations   HYPERTENSION CONTROL Vitals:   02/05/24 1536 02/05/24 1559  BP: (!) 144/94 (!) 140/90    The patient's blood pressure is elevated above target today.  In order to address the patient's elevated BP: The blood pressure is usually elevated in clinic.  Blood pressures monitored at home have been optimal.          Physical Exam VS:  BP (!) 140/90   Pulse 97   Ht 5' 7 (1.702 m)   Wt 208 lb (94.3 kg)   SpO2 96%   BMI 32.58 kg/m        Wt Readings from Last 3 Encounters:  02/05/24 208 lb (94.3 kg)  10/16/22 192 lb (87.1 kg)  11/14/21 186 lb (84.4 kg)    GEN: Well nourished, well developed in no acute distress NECK: No JVD; No carotid bruits CARDIAC: RRR, no murmurs, rubs, gallops RESPIRATORY:  Clear to auscultation without rales, wheezing or rhonchi  ABDOMEN: Soft, non-tender, non-distended EXTREMITIES:  No edema; No deformity   ASSESSMENT AND PLAN HTN - BP well controlled by  home readings.  Routinely 120s/70-80s at home versus clinic readings are typically higher.  He is interested in reducing medication regimen.  Will reduce losartan  to 25 mg daily.  Maitri message in 2 weeks to check in.  If BP routinely at goal is 130/80 will remain on reduced dose but if BP greater than 130/80 will plan to increase back to 50 mg dose. Discussed to monitor BP at home at least 2 hours after medications and sitting for 5-10 minutes.    TIA -prior imaging revealed microvascular disease.   Continue aspirin , statin, Plavix .  Previously recommended for long-term DAPT per neurology.   HLD, LDL goal <70 -tolerating atorvastatin  without myalgias.  05/2023 total cholesterol 127, HDL 64, LDL 51, triglycerides 52.  Continue atorvastatin  20 mg daily.          Dispo: follow up in 1 year  Signed, Reche GORMAN Finder, NP

## 2024-02-06 ENCOUNTER — Ambulatory Visit (HOSPITAL_BASED_OUTPATIENT_CLINIC_OR_DEPARTMENT_OTHER): Payer: Self-pay | Admitting: Family

## 2024-02-19 MED ORDER — LOSARTAN POTASSIUM 25 MG PO TABS: 25.0000 mg | ORAL_TABLET | Freq: Every day | ORAL | 3 refills | Status: AC

## 2024-02-28 ENCOUNTER — Other Ambulatory Visit: Payer: Self-pay | Admitting: Cardiovascular Disease

## 2024-02-28 DIAGNOSIS — E785 Hyperlipidemia, unspecified: Secondary | ICD-10-CM

## 2024-03-21 DIAGNOSIS — H02052 Trichiasis without entropian right lower eyelid: Secondary | ICD-10-CM | POA: Diagnosis not present

## 2024-03-21 DIAGNOSIS — H02055 Trichiasis without entropian left lower eyelid: Secondary | ICD-10-CM | POA: Diagnosis not present

## 2024-04-08 ENCOUNTER — Ambulatory Visit (HOSPITAL_BASED_OUTPATIENT_CLINIC_OR_DEPARTMENT_OTHER): Admitting: Family

## 2024-04-24 DIAGNOSIS — Z6834 Body mass index (BMI) 34.0-34.9, adult: Secondary | ICD-10-CM | POA: Diagnosis not present

## 2024-04-24 DIAGNOSIS — M0589 Other rheumatoid arthritis with rheumatoid factor of multiple sites: Secondary | ICD-10-CM | POA: Diagnosis not present

## 2024-04-24 DIAGNOSIS — R8290 Unspecified abnormal findings in urine: Secondary | ICD-10-CM | POA: Diagnosis not present

## 2024-04-24 DIAGNOSIS — Z79899 Other long term (current) drug therapy: Secondary | ICD-10-CM | POA: Diagnosis not present

## 2024-04-24 DIAGNOSIS — M1991 Primary osteoarthritis, unspecified site: Secondary | ICD-10-CM | POA: Diagnosis not present

## 2024-04-24 DIAGNOSIS — E669 Obesity, unspecified: Secondary | ICD-10-CM | POA: Diagnosis not present

## 2024-05-13 DIAGNOSIS — L821 Other seborrheic keratosis: Secondary | ICD-10-CM | POA: Diagnosis not present

## 2024-05-13 DIAGNOSIS — L570 Actinic keratosis: Secondary | ICD-10-CM | POA: Diagnosis not present

## 2024-06-09 DIAGNOSIS — Z8673 Personal history of transient ischemic attack (TIA), and cerebral infarction without residual deficits: Secondary | ICD-10-CM | POA: Diagnosis not present

## 2024-06-09 DIAGNOSIS — Z1331 Encounter for screening for depression: Secondary | ICD-10-CM | POA: Diagnosis not present

## 2024-06-09 DIAGNOSIS — I10 Essential (primary) hypertension: Secondary | ICD-10-CM | POA: Diagnosis not present

## 2024-06-09 DIAGNOSIS — Z0000 Encounter for general adult medical examination without abnormal findings: Secondary | ICD-10-CM | POA: Diagnosis not present

## 2024-06-09 DIAGNOSIS — E7800 Pure hypercholesterolemia, unspecified: Secondary | ICD-10-CM | POA: Diagnosis not present

## 2024-07-07 ENCOUNTER — Encounter (HOSPITAL_BASED_OUTPATIENT_CLINIC_OR_DEPARTMENT_OTHER): Payer: Self-pay

## 2024-07-07 ENCOUNTER — Telehealth (HOSPITAL_BASED_OUTPATIENT_CLINIC_OR_DEPARTMENT_OTHER): Payer: Self-pay

## 2024-07-07 NOTE — Telephone Encounter (Signed)
"  ° °  Pre-operative Risk Assessment    Patient Name: Jimmy Williams  DOB: Dec 28, 1940 MRN: [id number]   Date of last office visit: 02/05/2024 with Reche Finder, NP Date of next office visit: None  Request for Surgical Clearance    Procedure:  Simple dental cleaning  Date of Surgery:  Clearance 07/14/24                                 Surgeon:  Marzetta EMERSON Molt, DDS or Adrien PARAS. Sheral, DDS  Surgeon's Group or Practice Name:  Office of Marzetta EMERSON Molt, DDS and Adrien PARAS. Sheral, DDS  Phone number:  [phone] Fax number:  [phone]   Type of Clearance Requested:   - Medical  - Pharmacy:  Hold Aspirin  and Clopidogrel  (Plavix ) -needs instructions   Type of Anesthesia:  Not Indicated   Additional requests/questions:  None  Signed, Patrcia Iverson CROME   07/07/2024, 4:25 PM   "

## 2024-07-07 NOTE — Telephone Encounter (Signed)
"  ° °  Patient Name: Jimmy Williams  DOB: 01/01/1941 MRN: [id number]  Primary Cardiologist: Annabella Scarce, MD  Chart reviewed as part of pre-operative protocol coverage.   Simple dental extractions (i.e. 1-2 teeth) are considered low risk procedures per guidelines and generally do not require any specific cardiac clearance. It is also generally accepted that for simple extractions and dental cleanings, there is no need to interrupt blood thinner therapy.   SBE prophylaxis is not required for the patient from a cardiac standpoint.  I will route this recommendation to the requesting party via Epic fax function and remove from pre-op pool.  Please call with questions.  Lamarr Satterfield, NP 07/07/2024, 4:33 PM  "
